# Patient Record
Sex: Female | Born: 1990 | Race: White | Hispanic: No | Marital: Single | Smoking: Never smoker
Health system: Southern US, Community
[De-identification: ages and names within clinical notes are randomized; demographics above are authoritative.]

## PROBLEM LIST (undated history)

## (undated) DIAGNOSIS — F419 Anxiety disorder, unspecified: Secondary | ICD-10-CM

## (undated) DIAGNOSIS — J302 Other seasonal allergic rhinitis: Secondary | ICD-10-CM

## (undated) DIAGNOSIS — E079 Disorder of thyroid, unspecified: Secondary | ICD-10-CM

## (undated) DIAGNOSIS — J45909 Unspecified asthma, uncomplicated: Secondary | ICD-10-CM

## (undated) HISTORY — DX: Other seasonal allergic rhinitis: J30.2

## (undated) HISTORY — PX: DENTAL SURGERY: SHX609

## (undated) HISTORY — DX: Disorder of thyroid, unspecified: E07.9

## (undated) HISTORY — DX: Anxiety disorder, unspecified: F41.9

---

## 2013-06-07 ENCOUNTER — Encounter: Payer: Self-pay | Admitting: Podiatry

## 2013-06-07 ENCOUNTER — Ambulatory Visit (INDEPENDENT_AMBULATORY_CARE_PROVIDER_SITE_OTHER): Admitting: Podiatry

## 2013-06-07 VITALS — BP 124/85 | HR 76 | Resp 16

## 2013-06-07 DIAGNOSIS — L600 Ingrowing nail: Secondary | ICD-10-CM

## 2013-06-07 DIAGNOSIS — B351 Tinea unguium: Secondary | ICD-10-CM

## 2013-06-07 NOTE — Progress Notes (Signed)
   Subjective:    Patient ID: Taylor Gomez, female    DOB: 05/04/1990, 23 y.o.   MRN: [id number]  HPI Comments: "I have a toenail that I injured and now its just been really sore"  Patient c/o aching 1st toe left for about 1 year. She injured the toe moving last year and the nail was knocked loose. She pulled the nail off then, but now grown back discolored and sore. The area is red and swollen. She would like to keep the nail if possible.     Review of Systems  All other systems reviewed and are negative.      Objective:   Physical Exam        Assessment & Plan:

## 2013-06-07 NOTE — Progress Notes (Signed)
Subjective:     Patient ID: Taylor Gomez, female   DOB: 07-19-1990, 23 y.o.   MRN: [id number]  HPI patient states the big toenail my left foot has been bothering for around a year gets ingrown and the top of it is loose and already lost at one time   Review of Systems  All other systems reviewed and are negative.      Objective:   Physical Exam  Nursing note and vitals reviewed. Constitutional: She is oriented to person, place, and time.  Cardiovascular: Intact distal pulses.   Musculoskeletal: Normal range of motion.  Neurological: She is oriented to person, place, and time.  Skin: Skin is warm.   neurovascular status intact with muscle strength adequate in range of motion subtalar midtarsal joint within normal limits. Patient is found to have an incurvated left hallux medial border with the rest and the nail being loose yellow and abnormal in appearance. 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 I recommended removal of the medial corner removing the rest of the nail and allowing it to regrow with possibility that long-term she will lose the entire nail permanently. She wants this procedure and today I infiltrated 60 mg Xylocaine Marcaine mixture remove the medial border exposed matrix and applied chemical 3 applications phenol 30 seconds followed by alcohol and then removal of the remainder the nail and sterile dressing. Gave instructions on soaks

## 2013-06-07 NOTE — Patient Instructions (Signed)

## 2013-09-15 ENCOUNTER — Encounter: Payer: Self-pay | Admitting: Neurology

## 2013-09-15 ENCOUNTER — Ambulatory Visit (INDEPENDENT_AMBULATORY_CARE_PROVIDER_SITE_OTHER): Admitting: Neurology

## 2013-09-15 VITALS — BP 104/60 | HR 73 | Ht 67.0 in | Wt 207.0 lb

## 2013-09-15 DIAGNOSIS — Z5181 Encounter for therapeutic drug level monitoring: Secondary | ICD-10-CM

## 2013-09-15 DIAGNOSIS — R251 Tremor, unspecified: Secondary | ICD-10-CM

## 2013-09-15 DIAGNOSIS — G43709 Chronic migraine without aura, not intractable, without status migrainosus: Secondary | ICD-10-CM

## 2013-09-15 DIAGNOSIS — R5381 Other malaise: Secondary | ICD-10-CM

## 2013-09-15 DIAGNOSIS — E039 Hypothyroidism, unspecified: Secondary | ICD-10-CM

## 2013-09-15 DIAGNOSIS — R5383 Other fatigue: Secondary | ICD-10-CM

## 2013-09-15 DIAGNOSIS — R209 Unspecified disturbances of skin sensation: Secondary | ICD-10-CM

## 2013-09-15 DIAGNOSIS — R202 Paresthesia of skin: Secondary | ICD-10-CM

## 2013-09-15 DIAGNOSIS — R259 Unspecified abnormal involuntary movements: Secondary | ICD-10-CM

## 2013-09-15 LAB — HEPATIC FUNCTION PANEL
ALT: 20 U/L (ref 0–35)
AST: 19 U/L (ref 0–37)
Albumin: 4.2 g/dL (ref 3.5–5.2)
Alkaline Phosphatase: 77 U/L (ref 39–117)
BILIRUBIN DIRECT: 0.1 mg/dL (ref 0.0–0.3)
BILIRUBIN TOTAL: 0.4 mg/dL (ref 0.2–1.2)
Indirect Bilirubin: 0.3 mg/dL (ref 0.2–1.2)
Total Protein: 7.4 g/dL (ref 6.0–8.3)

## 2013-09-15 LAB — COMPREHENSIVE METABOLIC PANEL
ALBUMIN: 4.2 g/dL (ref 3.5–5.2)
ALK PHOS: 77 U/L (ref 39–117)
ALT: 20 U/L (ref 0–35)
AST: 19 U/L (ref 0–37)
BILIRUBIN TOTAL: 0.4 mg/dL (ref 0.2–1.2)
BUN: 12 mg/dL (ref 6–23)
CO2: 28 mEq/L (ref 19–32)
Calcium: 9.6 mg/dL (ref 8.4–10.5)
Chloride: 105 mEq/L (ref 96–112)
Creat: 0.73 mg/dL (ref 0.50–1.10)
GLUCOSE: 74 mg/dL (ref 70–99)
Potassium: 4.2 mEq/L (ref 3.5–5.3)
SODIUM: 141 meq/L (ref 135–145)
TOTAL PROTEIN: 7.4 g/dL (ref 6.0–8.3)

## 2013-09-15 LAB — CBC WITH DIFFERENTIAL/PLATELET
BASOS PCT: 1 % (ref 0–1)
Basophils Absolute: 0.1 10*3/uL (ref 0.0–0.1)
EOS ABS: 0.2 10*3/uL (ref 0.0–0.7)
Eosinophils Relative: 3 % (ref 0–5)
HEMATOCRIT: 36.2 % (ref 36.0–46.0)
HEMOGLOBIN: 12.1 g/dL (ref 12.0–15.0)
LYMPHS ABS: 2 10*3/uL (ref 0.7–4.0)
Lymphocytes Relative: 33 % (ref 12–46)
MCH: 25.5 pg — AB (ref 26.0–34.0)
MCHC: 33.4 g/dL (ref 30.0–36.0)
MCV: 76.2 fL — AB (ref 78.0–100.0)
MONO ABS: 0.5 10*3/uL (ref 0.1–1.0)
MONOS PCT: 9 % (ref 3–12)
Neutro Abs: 3.2 10*3/uL (ref 1.7–7.7)
Neutrophils Relative %: 54 % (ref 43–77)
Platelets: 312 10*3/uL (ref 150–400)
RBC: 4.75 MIL/uL (ref 3.87–5.11)
RDW: 14.7 % (ref 11.5–15.5)
WBC: 6 10*3/uL (ref 4.0–10.5)

## 2013-09-15 LAB — VITAMIN B12: VITAMIN B 12: 390 pg/mL (ref 211–911)

## 2013-09-15 MED ORDER — TOPIRAMATE 25 MG PO TABS: ORAL_TABLET | ORAL | Status: DC

## 2013-09-15 MED ORDER — TOPIRAMATE 100 MG PO TABS: 100.0000 mg | ORAL_TABLET | Freq: Every day | ORAL | Status: DC

## 2013-09-15 NOTE — Addendum Note (Signed)
Addended bySilvio Pate on: 09/15/2013 09:18 AM   Modules accepted: Orders

## 2013-09-15 NOTE — Progress Notes (Signed)
Subjective:   Taylor Gomez was seen in consultation in the movement disorder clinic at the request of [facility], MD.  The evaluation is for tremor.  The patient is a 23 y.o. right handed female with a history of tremor.  Pt states that it may have started about 6 months ago but it started bothering her a month ago because it was daily then.  It happens at rest mostly.  She doesn't know if it is worse any particular time of time.  Stress makes it worse.  If she grabs it and holds it she can make it stop for a short time.  There is no family hx of tremor.  She takes singulair at night and has been on it for 3-4 months.  She doesn't know if it affects tremor.  Noted some trouble with balance.  Admits to some posterior headaches.  Occur in the R occiput and radiates to the right eye.  They have been going on for about 6 years, but are now daily.  They are described as throbbing in nature.  Sometimes with photophobia.  No nausea or vomiting.  Never had any brain scan. She worries about a brain tumor because her grandma had one.  No changes in her voice.  Sleeps well at night but screams out and curses at night which is very unusual behavior for her.  Rare visual distortions.  Sense of taste is decreased as is smell but has significant allergies and relates to that.    Affected by caffeine:  Doesn't drink enough to know Affected by alcohol:  Doesn't drink alcohol Affected by stress:  Yes.   Affected by fatigue:  No. Spills soup if on spoon:  Yes.   Spills glass of liquid if full:  No. Affects ADL's (tying shoes, brushing teeth, etc):  No. but some trouble putting on makeup  Current/Previously tried tremor medications: None  Current medications that may exacerbate tremor:  Singulair  Outside reports reviewed: historical medical records, office notes and referral letter/letters.  No Known Allergies  Outpatient Encounter Prescriptions as of 09/15/2013  Medication Sig  . cetirizine (ZYRTEC) 10 MG  tablet Take 10 mg by mouth daily.  Marland Kitchen levothyroxine (SYNTHROID, LEVOTHROID) 50 MCG tablet Take 50 mcg by mouth daily before breakfast.  . montelukast (SINGULAIR) 10 MG tablet Take 10 mg by mouth at bedtime.    Past Medical History  Diagnosis Date  . Seasonal allergies   . Thyroid disease   . Anxiety     Past Surgical History  Procedure Laterality Date  . Dental surgery      History   Social History  . Marital Status: Unknown    Spouse Name: N/A    Number of Children: N/A  . Years of Education: N/A   Occupational History  .  Other    Nanny   Social History Main Topics  . Smoking status: Never Smoker   . Smokeless tobacco: Not on file  . Alcohol Use: No  . Drug Use: No  . Sexual Activity: Not on file   Other Topics Concern  . Not on file   Social History Narrative  . No narrative on file    Family Status  Relation Status Death Age  . Mother Alive     breast cancer  . Father Alive     healthy  . Sister Alive     healthy  . Sister Alive     healthy    Review of Systems Burning paresthesias  of the plantar aspect of the R foot and the 2nd toe on the right foot.  A complete 10 system ROS was obtained and was negative apart from what is mentioned.   Objective:   VITALS:   Filed Vitals:   09/15/13 0754  BP: 104/60  Pulse: 73  Height:  (1.702 m)  Weight: 207 lb (93.895 kg)   Gen:  Appears stated age and in NAD. HEENT:  Normocephalic, atraumatic. The mucous membranes are moist. The superficial temporal arteries are without ropiness or tenderness. Cardiovascular: Regular rate and rhythm. Lungs: Clear to auscultation bilaterally. Neck: There are no carotid bruits noted bilaterally.  NEUROLOGICAL:  Orientation:  The patient is alert and oriented x 3.  Recent and remote memory are intact.  Attention span and concentration are normal.  Able to name objects and repeat without trouble.  Fund of knowledge is appropriate Cranial nerves: There is good  facial symmetry. The pupils are equal round and reactive to light bilaterally. Fundoscopic exam reveals clear disc margins bilaterally. Extraocular muscles are intact and visual fields are full to confrontational testing. Speech is fluent and clear. Soft palate rises symmetrically and there is no tongue deviation. Hearing is intact to conversational tone. Tone: Tone is good throughout. Sensation: Sensation is intact to light touch and pinprick throughout (facial, trunk, extremities). Vibration is intact at the bilateral big toe. There is no extinction with double simultaneous stimulation. There is no sensory dermatomal level identified. Coordination:  The patient has no dysdiadichokinesia or dysmetria. Motor: Strength is 5/5 in the bilateral upper and lower extremities.  Shoulder shrug is equal bilaterally.  There is no pronator drift.  There are no fasciculations noted. DTR's: Deep tendon reflexes are 2/4 at the bilateral biceps, triceps, brachioradialis, patella and achilles.  Plantar responses are downgoing bilaterally. Gait and Station: The patient is able to ambulate without difficulty. The patient is able to heel toe walk without any difficulty. The patient is able to ambulate in a tandem fashion. The patient is able to stand in the Romberg position.   MOVEMENT EXAM: Tremor:  There is some tremor in the right upper extremity that is distractible at rest and not present when she is counting or naming the months backwards.  It is not present with intention.  It is present mildly when she is pouring water from one glass to another.  She has very little difficulty with Archimedes spirals.  She has no difficulty with writing a sentence.     Assessment/Plan:   1.  Tremor.  -I wonder if this is multifactorial.  She noticed an increase in tremor over the last month and was put on Singulair just a few months ago.  This can pick up tremor.  I also wonder if anxiety is a component to her tremor as it was  somewhat distractible.  Her primary care physician has checked her TSH, and her sensory was recently adjusted.  I will try to get a copy of her recent labs.  I will also draw a CBC, CMP as well as a B12 (given fatigue and paresthesias), copper and ceruloplasmin.  -We would do an MRI of the brain given the asymmetry of tremor and headache, although I see nothing unusual on her examination today. 2.  transformed migraine/chronic migraine.  -She and I talked about the diagnosis.  We talked about treatment that would help both tremor and migraine, and ultimately decided that she would like to try Topamax.  We talked about risks/benefits/side effects.  If she become sexually active, she understands that she should use birth control.  She understands that this medicine could reduce the efficacy of oral contraceptives.  She understands the increased risk of nephrolithiasis.  She understands the risk of cognitive dulling.  She understands that this medication could increase paresthesias initially. 3.  Paresthesias.  -The patient asks about I nerve conduction study.  I really do not think that this would be useful.  I found no evidence of neuropathy or radiculopathy on her examination today.  She only has paresthesias along a very small portion of the plantar aspect of her foot and second toe on the right, and I think I nerve conduction study would be of low yield.  She has no back pain.  I told her I think we should hold on that. 4.  followup in the next few months, sooner should new neurologic issues arise.

## 2013-09-15 NOTE — Patient Instructions (Addendum)
1.Your provider has requested that you have labwork completed today. Please go to [facility] on the first floor of this building before leaving the office today. 2. We have scheduled you at [facility] for your MRI on 09/28/13 at 1:00 pm. Please arrive 15 minutes prior and go to 1st floor radiology. If you need to reschedule for any reason please call [phone]. 3. Take Topamax at follows: 25 mg tablets - take one daily for one week, then increase to two daily for one week, then increase to three daily for one week and then start 100 mg tablets one tablet daily.

## 2013-09-17 LAB — COPPER, SERUM: Copper: 97 ug/dL (ref 70–175)

## 2013-09-19 LAB — CERULOPLASMIN: Ceruloplasmin: 23 mg/dL (ref 18–53)

## 2013-09-28 ENCOUNTER — Emergency Department (HOSPITAL_COMMUNITY)

## 2013-09-28 ENCOUNTER — Ambulatory Visit (HOSPITAL_COMMUNITY)
Admission: RE | Admit: 2013-09-28 | Discharge: 2013-09-28 | Disposition: A | Discharge status: Home / Self Care | Source: Ambulatory Visit | Attending: Neurology | Admitting: Neurology

## 2013-09-28 ENCOUNTER — Emergency Department (HOSPITAL_COMMUNITY)
Admission: EM | Admit: 2013-09-28 | Discharge: 2013-09-29 | Disposition: A | Discharge status: Home / Self Care | Attending: Emergency Medicine | Admitting: Emergency Medicine

## 2013-09-28 ENCOUNTER — Encounter (HOSPITAL_COMMUNITY): Payer: Self-pay | Admitting: Emergency Medicine

## 2013-09-28 DIAGNOSIS — Z3202 Encounter for pregnancy test, result negative: Secondary | ICD-10-CM | POA: Diagnosis not present

## 2013-09-28 DIAGNOSIS — Z79899 Other long term (current) drug therapy: Secondary | ICD-10-CM | POA: Diagnosis not present

## 2013-09-28 DIAGNOSIS — R1031 Right lower quadrant pain: Secondary | ICD-10-CM | POA: Insufficient documentation

## 2013-09-28 DIAGNOSIS — E079 Disorder of thyroid, unspecified: Secondary | ICD-10-CM | POA: Diagnosis not present

## 2013-09-28 DIAGNOSIS — G43709 Chronic migraine without aura, not intractable, without status migrainosus: Secondary | ICD-10-CM | POA: Diagnosis not present

## 2013-09-28 DIAGNOSIS — Z8659 Personal history of other mental and behavioral disorders: Secondary | ICD-10-CM | POA: Insufficient documentation

## 2013-09-28 DIAGNOSIS — R112 Nausea with vomiting, unspecified: Secondary | ICD-10-CM | POA: Diagnosis not present

## 2013-09-28 LAB — URINALYSIS, ROUTINE W REFLEX MICROSCOPIC
Bilirubin Urine: NEGATIVE
Glucose, UA: NEGATIVE mg/dL
Hgb urine dipstick: NEGATIVE
KETONES UR: NEGATIVE mg/dL
LEUKOCYTES UA: NEGATIVE
NITRITE: NEGATIVE
PH: 6.5 (ref 5.0–8.0)
PROTEIN: NEGATIVE mg/dL
Specific Gravity, Urine: 1.005 (ref 1.005–1.030)
UROBILINOGEN UA: 0.2 mg/dL (ref 0.0–1.0)

## 2013-09-28 LAB — COMPREHENSIVE METABOLIC PANEL
ALT: 17 U/L (ref 0–35)
ANION GAP: 13 (ref 5–15)
AST: 20 U/L (ref 0–37)
Albumin: 3.8 g/dL (ref 3.5–5.2)
Alkaline Phosphatase: 80 U/L (ref 39–117)
BUN: 12 mg/dL (ref 6–23)
CO2: 24 mEq/L (ref 19–32)
Calcium: 9.3 mg/dL (ref 8.4–10.5)
Chloride: 105 mEq/L (ref 96–112)
Creatinine, Ser: 0.69 mg/dL (ref 0.50–1.10)
GFR calc Af Amer: >90 mL/min (ref >90)
GFR calc non Af Amer: >90 mL/min (ref >90)
Glucose, Bld: 94 mg/dL (ref 70–99)
Potassium: 4 mEq/L (ref 3.7–5.3)
SODIUM: 142 meq/L (ref 137–147)
Total Bilirubin: 0.3 mg/dL (ref 0.3–1.2)
Total Protein: 7.7 g/dL (ref 6.0–8.3)

## 2013-09-28 LAB — CBC WITH DIFFERENTIAL/PLATELET
Basophils Absolute: 0.1 10*3/uL (ref 0.0–0.1)
Basophils Relative: 1 % (ref 0–1)
Eosinophils Absolute: 0.2 10*3/uL (ref 0.0–0.7)
Eosinophils Relative: 2 % (ref 0–5)
HCT: 38 % (ref 36.0–46.0)
Hemoglobin: 12.7 g/dL (ref 12.0–15.0)
LYMPHS ABS: 2.5 10*3/uL (ref 0.7–4.0)
Lymphocytes Relative: 35 % (ref 12–46)
MCH: 26.2 pg (ref 26.0–34.0)
MCHC: 33.4 g/dL (ref 30.0–36.0)
MCV: 78.4 fL (ref 78.0–100.0)
Monocytes Absolute: 0.5 10*3/uL (ref 0.1–1.0)
Monocytes Relative: 7 % (ref 3–12)
Neutro Abs: 4 10*3/uL (ref 1.7–7.7)
Neutrophils Relative %: 55 % (ref 43–77)
PLATELETS: 332 10*3/uL (ref 150–400)
RBC: 4.85 MIL/uL (ref 3.87–5.11)
RDW: 14 % (ref 11.5–15.5)
WBC: 7.2 10*3/uL (ref 4.0–10.5)

## 2013-09-28 LAB — WET PREP, GENITAL
Trich, Wet Prep: NONE SEEN
WBC, Wet Prep HPF POC: NONE SEEN
Yeast Wet Prep HPF POC: NONE SEEN

## 2013-09-28 LAB — PREGNANCY, URINE: Preg Test, Ur: NEGATIVE

## 2013-09-28 LAB — LIPASE, BLOOD: Lipase: 27 U/L (ref 11–59)

## 2013-09-28 MED ORDER — MORPHINE SULFATE 4 MG/ML IJ SOLN
4.0000 mg | Freq: Once | INTRAMUSCULAR | Status: AC
  Administered 2013-09-29: 4 mg via INTRAVENOUS
  Filled 2013-09-28: qty 1

## 2013-09-28 MED ORDER — IOHEXOL 300 MG/ML  SOLN
25.0000 mL | Freq: Once | INTRAMUSCULAR | Status: AC | PRN
  Administered 2013-09-28: 25 mL via ORAL

## 2013-09-28 MED ORDER — ONDANSETRON HCL 4 MG/2ML IJ SOLN
4.0000 mg | Freq: Once | INTRAMUSCULAR | Status: AC
  Administered 2013-09-28: 4 mg via INTRAVENOUS
  Filled 2013-09-28: qty 2

## 2013-09-28 MED ORDER — MORPHINE SULFATE 4 MG/ML IJ SOLN
4.0000 mg | Freq: Once | INTRAMUSCULAR | Status: AC
  Administered 2013-09-28: 4 mg via INTRAVENOUS
  Filled 2013-09-28: qty 1

## 2013-09-28 MED ORDER — IOHEXOL 300 MG/ML  SOLN
100.0000 mL | Freq: Once | INTRAMUSCULAR | Status: AC | PRN
  Administered 2013-09-28: 100 mL via INTRAVENOUS

## 2013-09-28 NOTE — ED Provider Notes (Signed)
CSN: [id number]     Arrival date & time 09/28/13  1946 History   First MD Initiated Contact with Patient 09/28/13 2058     Chief Complaint  Patient presents with  . Abdominal Pain     (Consider location/radiation/quality/duration/timing/severity/associated sxs/prior Treatment) HPI Taylor Gomez is a 23 y.o. female who presents emergency department complaining of abdominal pain. Patient states she has high dural abdominal pain for several days, however yesterday she developed sharp pain in her right lower abdomen which has been constant since. States pain is worsened with walking and movement. Certain positions make pain better, certainly worse. She denies any nausea or vomiting. She denies any urinary symptoms, no vaginal discharge or bleeding. She denies any fever or chills. She admits to having no appetite today. Patient went to urgent care, had urinalyses done there, was told to report to emergency department for further evaluation. Patient denies any prior abdominal surgeries, no other associated symptoms at this time.  Past Medical History  Diagnosis Date  . Seasonal allergies   . Thyroid disease   . Anxiety    Past Surgical History  Procedure Laterality Date  . Dental surgery     No family history on file. History  Substance Use Topics  . Smoking status: Never Smoker   . Smokeless tobacco: Not on file  . Alcohol Use: No   OB History   Grav Para Term Preterm Abortions TAB SAB Ect Mult Living                 Review of Systems  Constitutional: Negative for fever and chills.  Respiratory: Negative for cough, chest tightness and shortness of breath.   Cardiovascular: Negative for chest pain, palpitations and leg swelling.  Gastrointestinal: Positive for nausea, vomiting and abdominal pain. Negative for diarrhea.  Genitourinary: Positive for pelvic pain. Negative for dysuria, flank pain, vaginal bleeding, vaginal discharge and vaginal pain.  Musculoskeletal: Negative for  arthralgias, myalgias, neck pain and neck stiffness.  Skin: Negative for rash.  Neurological: Negative for dizziness, weakness and headaches.  All other systems reviewed and are negative.     Allergies  Review of patient's allergies indicates no known allergies.  Home Medications   Prior to Admission medications   Medication Sig Start Date End Date Taking? Authorizing Provider  cetirizine (ZYRTEC) 10 MG tablet Take 10 mg by mouth daily.    Historical Provider, MD  levothyroxine (SYNTHROID, LEVOTHROID) 50 MCG tablet Take 50 mcg by mouth daily before breakfast.    Historical Provider, MD  montelukast (SINGULAIR) 10 MG tablet Take 10 mg by mouth at bedtime.    Historical Provider, MD  topiramate (TOPAMAX) 100 MG tablet Take 1 tablet (100 mg total) by mouth daily. 09/15/13   Octaviano Batty Tat, DO  topiramate (TOPAMAX) 25 MG tablet Take 1 tablet by mouth for one week, then 2 tablets by mouth for 1 week, then 3 tablets by mouth for 1 week then switch to 100 mg tablets 09/15/13   Rebecca S Tat, DO   BP 127/76  Pulse 86  Temp(Src) 98.5 F (36.9 C) (Oral)  Resp 14  Ht  (1.702 m)  Wt 206 lb (93.441 kg)  BMI 32.26 kg/m2  SpO2 99%  LMP 08/08/2013 Physical Exam  Nursing note and vitals reviewed. Constitutional: She appears well-developed and well-nourished. No distress.  HENT:  Head: Normocephalic.  Eyes: Conjunctivae are normal.  Neck: Neck supple.  Cardiovascular: Normal rate, regular rhythm and normal heart sounds.   Pulmonary/Chest: Effort normal  and breath sounds normal. No respiratory distress. She has no wheezes. She has no rales.  Abdominal: Soft. Bowel sounds are normal. She exhibits no distension. There is tenderness. There is no rebound.  RLQ tenderness, tender at mcburney's point  Genitourinary:  Normal external genitalia. No vaginal discharge. Cervix normal. No CMT. Right adnexal tenderness  Musculoskeletal: She exhibits no edema.  Neurological: She is alert.  Skin: Skin is  warm and dry.  Psychiatric: She has a normal mood and affect. Her behavior is normal.    ED Course  Procedures (including critical care time) Labs Review Labs Reviewed  WET PREP, GENITAL  GC/CHLAMYDIA PROBE AMP  CBC WITH DIFFERENTIAL  COMPREHENSIVE METABOLIC PANEL  LIPASE, BLOOD  PREGNANCY, URINE  URINALYSIS, ROUTINE W REFLEX MICROSCOPIC    Imaging Review Mr Brain Wo Contrast  09/28/2013   CLINICAL DATA:  Migraine headache  EXAM: MRI HEAD WITHOUT CONTRAST  TECHNIQUE: Multiplanar, multiecho pulse sequences of the brain and surrounding structures were obtained without intravenous contrast.  COMPARISON:  None.  FINDINGS: Ventricle size is normal. Negative for Chiari malformation. Pituitary normal in size. Cerebral volume normal.  Negative for acute infarct  Negative for chronic ischemia.  Negative for demyelinating disease. Cerebral white matter is normal. Brainstem and cerebellum are normal.  Negative for hemorrhage.  Negative for mass or edema.  Paranasal sinuses are clear.  IMPRESSION: Normal   Electronically Signed   By: Marlan Palau M.D.   On: 09/28/2013 16:19     EKG Interpretation None      MDM   Final diagnoses:  None    Pt with RLQ tenderness for 2 days. Question appy vs right ovrain cyst or torsion. Given symptoms since yesterday, non ill appearing, will get CT abd/pelvis to start work up. If negative will need US pelvis to rule out torsion.  1:12 AM CT negative for appy. Right ovarian cyst. Will [place] for further evaluation and to r/o torsion. Pain managed with morphine and zofran.   Filed Vitals:   09/28/13 2143 09/28/13 2200 09/28/13 2300 09/29/13 0054  BP:  126/78 115/62   Pulse:  84 72   Temp: 98.6 F (37 C)   98 F (36.7 C)  TempSrc: Oral   Oral  Resp:  16 15   Height:      Weight:      SpO2:  100% 99%      2:09 AM Pt signed out at shift change to Dr. Raliegh Ip, PA-C 09/29/13 [phone]

## 2013-09-28 NOTE — ED Notes (Signed)
Pt. reports RLQ pain onset 3 days ago with emesis x2 yesterday , denies nausea or vomitting , no fever or chills.

## 2013-09-29 ENCOUNTER — Telehealth: Payer: Self-pay | Admitting: Neurology

## 2013-09-29 ENCOUNTER — Emergency Department (HOSPITAL_COMMUNITY)

## 2013-09-29 MED ORDER — NAPROXEN 500 MG PO TABS: 500.0000 mg | ORAL_TABLET | Freq: Two times a day (BID) | ORAL | Status: DC

## 2013-09-29 MED ORDER — OXYCODONE-ACETAMINOPHEN 5-325 MG PO TABS
1.0000 | ORAL_TABLET | Freq: Four times a day (QID) | ORAL | Status: DC | PRN
Start: 2013-09-29 — End: 2014-07-01

## 2013-09-29 MED ORDER — ONDANSETRON HCL 4 MG/2ML IJ SOLN
4.0000 mg | Freq: Once | INTRAMUSCULAR | Status: AC
  Administered 2013-09-29: 4 mg via INTRAVENOUS
  Filled 2013-09-29: qty 2

## 2013-09-29 NOTE — ED Provider Notes (Signed)
Medical screening examination/treatment/procedure(s) were performed by non-physician practitioner and as supervising physician I was immediately available for consultation/collaboration.   EKG Interpretation None        Porshe Fleagle, MD 09/29/13 0734 

## 2013-09-29 NOTE — Telephone Encounter (Signed)
Message copied by Silvio Pate on Fri Sep 29, 2013  8:56 AM ------      Message from: TAT, REBECCA S      Created: Thu Sep 28, 2013  4:33 PM       Please call pt and let her know that I reviewed MRI brain and looks good. ------

## 2013-09-29 NOTE — Telephone Encounter (Signed)
Patient returned your call. She was advised of her MRI results

## 2013-09-29 NOTE — ED Provider Notes (Signed)
1610 - Patient care assumed from [facility], PA-C at shift change. Patient with pelvic ultrasound pending to rule out torsion. Imaging reviewed which shows no evidence of torsion. Ovaries are unremarkable. Unclear etiology for abdominal pain; however, patient does not appear to be emergent in nature. Possible that symptoms may be viral vs ruptured ovarian cyst vs mesenteric adenitis. Will treat as outpatient with naproxen. Percocet prescribed for breakthrough pain control as needed. Return precautions discussed and provided. Patient agreeable to plan with no unaddressed concerns.   Filed Vitals:   09/29/13 0054 09/29/13 0100 09/29/13 0200 09/29/13 0201  BP:  123/79 113/56 113/56  Pulse:  83 80 86  Temp: 98 F (36.7 C)   98.3 F (36.8 C)  TempSrc: Oral   Oral  Resp:  Height:      Weight:      SpO2:  99% 98% 99%   Results for orders placed during the hospital encounter of 09/28/13  WET PREP, GENITAL      Result Value Ref Range   Yeast Wet Prep HPF POC NONE SEEN  NONE SEEN   Trich, Wet Prep NONE SEEN  NONE SEEN   Clue Cells Wet Prep HPF POC FEW (*) NONE SEEN   WBC, Wet Prep HPF POC NONE SEEN  NONE SEEN  CBC WITH DIFFERENTIAL      Result Value Ref Range   WBC 7.2  4.0 - 10.5 K/uL   RBC 4.85  3.87 - 5.11 MIL/uL   Hemoglobin 12.7  12.0 - 15.0 g/dL   HCT 96.0  45.4 - 09.8 %   MCV 78.4  78.0 - 100.0 fL   MCH 26.2  26.0 - 34.0 pg   MCHC 33.4  30.0 - 36.0 g/dL   RDW 11.9  14.7 - 82.9 %   Platelets 332  150 - 400 K/uL   Neutrophils Relative % 55  43 - 77 %   Neutro Abs 4.0  1.7 - 7.7 K/uL   Lymphocytes Relative 35  12 - 46 %   Lymphs Abs 2.5  0.7 - 4.0 K/uL   Monocytes Relative 7  3 - 12 %   Monocytes Absolute 0.5  0.1 - 1.0 K/uL   Eosinophils Relative 2  0 - 5 %   Eosinophils Absolute 0.2  0.0 - 0.7 K/uL   Basophils Relative 1  0 - 1 %   Basophils Absolute 0.1  0.0 - 0.1 K/uL  COMPREHENSIVE METABOLIC PANEL      Result Value Ref Range   Sodium 142  137 - 147 mEq/L    Potassium 4.0  3.7 - 5.3 mEq/L   Chloride 105  96 - 112 mEq/L   CO2 24  19 - 32 mEq/L   Glucose, Bld 94  70 - 99 mg/dL   BUN 12  6 - 23 mg/dL   Creatinine, Ser 5.62  0.50 - 1.10 mg/dL   Calcium 9.3  8.4 - 13.0 mg/dL   Total Protein 7.7  6.0 - 8.3 g/dL   Albumin 3.8  3.5 - 5.2 g/dL   AST 20  0 - 37 U/L   ALT 17  0 - 35 U/L   Alkaline Phosphatase 80  39 - 117 U/L   Total Bilirubin 0.3  0.3 - 1.2 mg/dL   GFR calc non Af Amer >90  >90 mL/min   GFR calc Af Amer >90  >90 mL/min   Anion gap 13  5 - 15  LIPASE, BLOOD  Result Value Ref Range   Lipase 27  11 - 59 U/L  PREGNANCY, URINE      Result Value Ref Range   Preg Test, Ur NEGATIVE  NEGATIVE  URINALYSIS, ROUTINE W REFLEX MICROSCOPIC      Result Value Ref Range   Color, Urine YELLOW  YELLOW   APPearance CLEAR  CLEAR   Specific Gravity, Urine 1.005  1.005 - 1.030   pH 6.5  5.0 - 8.0   Glucose, UA NEGATIVE  NEGATIVE mg/dL   Hgb urine dipstick NEGATIVE  NEGATIVE   Bilirubin Urine NEGATIVE  NEGATIVE   Ketones, ur NEGATIVE  NEGATIVE mg/dL   Protein, ur NEGATIVE  NEGATIVE mg/dL   Urobilinogen, UA 0.2  0.0 - 1.0 mg/dL   Nitrite NEGATIVE  NEGATIVE   Leukocytes, UA NEGATIVE  NEGATIVE   Mr Brain Wo Contrast  09/28/2013   CLINICAL DATA:  Migraine headache  EXAM: MRI HEAD WITHOUT CONTRAST  TECHNIQUE: Multiplanar, multiecho pulse sequences of the brain and surrounding structures were obtained without intravenous contrast.  COMPARISON:  None.  FINDINGS: Ventricle size is normal. Negative for Chiari malformation. Pituitary normal in size. Cerebral volume normal.  Negative for acute infarct  Negative for chronic ischemia.  Negative for demyelinating disease. Cerebral white matter is normal. Brainstem and cerebellum are normal.  Negative for hemorrhage.  Negative for mass or edema.  Paranasal sinuses are clear.  IMPRESSION: Normal   Electronically Signed   By: Marlan Palau M.D.   On: 09/28/2013 16:19   US Transvaginal Non-ob  09/29/2013    CLINICAL DATA:  Abdominal pain.  EXAM: TRANSABDOMINAL AND TRANSVAGINAL ULTRASOUND OF PELVIS  DOPPLER ULTRASOUND OF OVARIES  TECHNIQUE: Both transabdominal and transvaginal ultrasound examinations of the pelvis were performed. Transabdominal technique was performed for global imaging of the pelvis including uterus, ovaries, adnexal regions, and pelvic cul-de-sac.  It was necessary to proceed with endovaginal exam following the transabdominal exam to visualize the uterus and ovaries in greater detail. Color and duplex Doppler ultrasound was utilized to evaluate blood flow to the ovaries.  COMPARISON:  CT of the abdomen and pelvis performed 09/28/2013  FINDINGS: Uterus  Measurements: 9.0 x 3.3 x 4.6 cm. No fibroids or other mass visualized.  Endometrium  Thickness: 0.8 cm. No focal abnormality visualized. Trace fluid is noted at the lower uterine segment.  Right ovary  Measurements: 4.1 x 2.5 x 2.7 cm. Normal appearance/no adnexal mass. A normal follicle is noted at the right ovary.  Left ovary  Measurements: 3.3 x 1.7 x 2.4 cm. Normal appearance/no adnexal mass.  Pulsed Doppler evaluation of both ovaries demonstrates normal low-resistance arterial and venous waveforms.  Other findings  A small amount of free fluid is seen within the pelvic cul-de-sac.  IMPRESSION: 1. No evidence of ovarian torsion. Ovaries unremarkable in appearance. 2. Trace fluid at the lower uterine segment. Uterus otherwise unremarkable.   Electronically Signed   By: Roanna Raider M.D.   On: 09/29/2013 03:36   US Pelvis Complete  09/29/2013   CLINICAL DATA:  Abdominal pain.  EXAM: TRANSABDOMINAL AND TRANSVAGINAL ULTRASOUND OF PELVIS  DOPPLER ULTRASOUND OF OVARIES  TECHNIQUE: Both transabdominal and transvaginal ultrasound examinations of the pelvis were performed. Transabdominal technique was performed for global imaging of the pelvis including uterus, ovaries, adnexal regions, and pelvic cul-de-sac.  It was necessary to proceed with  endovaginal exam following the transabdominal exam to visualize the uterus and ovaries in greater detail. Color and duplex Doppler ultrasound was utilized to evaluate blood  flow to the ovaries.  COMPARISON:  CT of the abdomen and pelvis performed 09/28/2013  FINDINGS: Uterus  Measurements: 9.0 x 3.3 x 4.6 cm. No fibroids or other mass visualized.  Endometrium  Thickness: 0.8 cm. No focal abnormality visualized. Trace fluid is noted at the lower uterine segment.  Right ovary  Measurements: 4.1 x 2.5 x 2.7 cm. Normal appearance/no adnexal mass. A normal follicle is noted at the right ovary.  Left ovary  Measurements: 3.3 x 1.7 x 2.4 cm. Normal appearance/no adnexal mass.  Pulsed Doppler evaluation of both ovaries demonstrates normal low-resistance arterial and venous waveforms.  Other findings  A small amount of free fluid is seen within the pelvic cul-de-sac.  IMPRESSION: 1. No evidence of ovarian torsion. Ovaries unremarkable in appearance. 2. Trace fluid at the lower uterine segment. Uterus otherwise unremarkable.   Electronically Signed   By: Roanna Raider M.D.   On: 09/29/2013 03:36   Ct Abdomen Pelvis W Contrast  09/29/2013   CLINICAL DATA:  Right-sided abdominal pain  EXAM: CT ABDOMEN AND PELVIS WITH CONTRAST  TECHNIQUE: Multidetector CT imaging of the abdomen and pelvis was performed using the standard protocol following bolus administration of intravenous contrast.  CONTRAST:  OMNIPAQUE IOHEXOL 300 MG/ML  SOLN  COMPARISON:  None.  FINDINGS: A 3 mm nodule present within the left lower lobe. The visualized lung bases are otherwise clear. No pleural or pericardial effusion.  The liver demonstrates a normal contrast enhanced appearance. Gallbladder within normal limits. No biliary dilatation. The spleen, adrenal glands, and pancreas demonstrate a normal contrast enhanced appearance.  Kidneys are equal in size with symmetric enhancement. No nephrolithiasis, hydronephrosis, or focal enhancing renal mass.   Stomach is within normal limits. No evidence of bowel obstruction. Appendix well visualized in the right lower quadrant and is of normal caliber and appearance without associated inflammatory changes to suggest acute appendicitis. No abnormal wall thickening, mucosal enhancement, or inflammatory stranding seen about the bowels.  Bladder within normal limits.  Fluid density noted within the endometrial canal. Uterus is otherwise unremarkable. Left ovary within normal limits. 2.0 x 2.0 x 1.7 cm cyst present within the right ovary, most compatible with a physiologic cyst. There is associated small volume free fluid within the pelvis.  No free air.  No adenopathy. Normal intravascular enhancement present within the abdomen and pelvis.  No acute osseous abnormality. No worrisome lytic or blastic osseous lesions.  IMPRESSION: 1. 2.0 x 2.0 x 1.7 cm physiologic right ovarian cyst with small volume free fluid within the pelvis. 2. Normal appendix. 3. 3 mm left lower lobe nodule, indeterminate. Please note that Fleischner criteria does not apply to patients of this age.   Electronically Signed   By: Rise Mu M.D.   On: 09/29/2013 00:11   [place] Art/ven Flow Abd Pelv Doppler  09/29/2013   CLINICAL DATA:  Abdominal pain.  EXAM: TRANSABDOMINAL AND TRANSVAGINAL ULTRASOUND OF PELVIS  DOPPLER ULTRASOUND OF OVARIES  TECHNIQUE: Both transabdominal and transvaginal ultrasound examinations of the pelvis were performed. Transabdominal technique was performed for global imaging of the pelvis including uterus, ovaries, adnexal regions, and pelvic cul-de-sac.  It was necessary to proceed with endovaginal exam following the transabdominal exam to visualize the uterus and ovaries in greater detail. Color and duplex Doppler ultrasound was utilized to evaluate blood flow to the ovaries.  COMPARISON:  CT of the abdomen and pelvis performed 09/28/2013  FINDINGS: Uterus  Measurements: 9.0 x 3.3 x 4.6 cm. No fibroids or other mass  visualized.  Endometrium  Thickness: 0.8 cm. No focal abnormality visualized. Trace fluid is noted at the lower uterine segment.  Right ovary  Measurements: 4.1 x 2.5 x 2.7 cm. Normal appearance/no adnexal mass. A normal follicle is noted at the right ovary.  Left ovary  Measurements: 3.3 x 1.7 x 2.4 cm. Normal appearance/no adnexal mass.  Pulsed Doppler evaluation of both ovaries demonstrates normal low-resistance arterial and venous waveforms.  Other findings  A small amount of free fluid is seen within the pelvic cul-de-sac.  IMPRESSION: 1. No evidence of ovarian torsion. Ovaries unremarkable in appearance. 2. Trace fluid at the lower uterine segment. Uterus otherwise unremarkable.   Electronically Signed   By: Roanna Raider M.D.   On: 09/29/2013 03:36      Antony Madura, PA-C 09/29/13 1324

## 2013-09-29 NOTE — Telephone Encounter (Signed)
Left message on machine for patient to call back. To let her know MR brain is okay. Awaiting call back.

## 2013-09-29 NOTE — Discharge Instructions (Signed)
Abdominal Pain, Women °Abdominal (stomach, pelvic, or belly) pain can be caused by many things. It is important to tell your doctor: °· The location of the pain. °· Does it come and go or is it present all the time? °· Are there things that start the pain (eating certain foods, exercise)? °· Are there other symptoms associated with the pain (fever, nausea, vomiting, diarrhea)? °All of this is helpful to know when trying to find the cause of the pain. °CAUSES  °· Stomach: virus or bacteria infection, or ulcer. °· Intestine: appendicitis (inflamed appendix), regional ileitis (Crohn's disease), ulcerative colitis (inflamed colon), irritable bowel syndrome, diverticulitis (inflamed diverticulum of the colon), or cancer of the stomach or intestine. °· Gallbladder disease or stones in the gallbladder. °· Kidney disease, kidney stones, or infection. °· Pancreas infection or cancer. °· Fibromyalgia (pain disorder). °· Diseases of the female organs: °¨ Uterus: fibroid (non-cancerous) tumors or infection. °¨ Fallopian tubes: infection or tubal pregnancy. °¨ Ovary: cysts or tumors. °¨ Pelvic adhesions (scar tissue). °¨ Endometriosis (uterus lining tissue growing in the pelvis and on the pelvic organs). °¨ Pelvic congestion syndrome (female organs filling up with blood just before the menstrual period). °¨ Pain with the menstrual period. °¨ Pain with ovulation (producing an egg). °¨ Pain with an IUD (intrauterine device, birth control) in the uterus. °¨ Cancer of the female organs. °· Functional pain (pain not caused by a disease, may improve without treatment). °· Psychological pain. °· Depression. °DIAGNOSIS  °Your doctor will decide the seriousness of your pain by doing an examination. °· Blood tests. °· X-rays. °· Ultrasound. °· CT scan (computed tomography, special type of X-ray). °· MRI (magnetic resonance imaging). °· Cultures, for infection. °· Barium enema (dye inserted in the large intestine, to better view it with  X-rays). °· Colonoscopy (looking in intestine with a lighted tube). °· Laparoscopy (minor surgery, looking in abdomen with a lighted tube). °· Major abdominal exploratory surgery (looking in abdomen with a large incision). °TREATMENT  °The treatment will depend on the cause of the pain.  °· Many cases can be observed and treated at home. °· Over-the-counter medicines recommended by your caregiver. °· Prescription medicine. °· Antibiotics, for infection. °· Birth control pills, for painful periods or for ovulation pain. °· Hormone treatment, for endometriosis. °· Nerve blocking injections. °· Physical therapy. °· Antidepressants. °· Counseling with a psychologist or psychiatrist. °· Minor or major surgery. °HOME CARE INSTRUCTIONS  °· Do not take laxatives, unless directed by your caregiver. °· Take over-the-counter pain medicine only if ordered by your caregiver. Do not take aspirin because it can cause an upset stomach or bleeding. °· Try a clear liquid diet (broth or water) as ordered by your caregiver. Slowly move to a bland diet, as tolerated, if the pain is related to the stomach or intestine. °· Have a thermometer and take your temperature several times a day, and record it. °· Bed rest and sleep, if it helps the pain. °· Avoid sexual intercourse, if it causes pain. °· Avoid stressful situations. °· Keep your follow-up appointments and tests, as your caregiver orders. °· If the pain does not go away with medicine or surgery, you may try: °¨ Acupuncture. °¨ Relaxation exercises (yoga, meditation). °¨ Group therapy. °¨ Counseling. °SEEK MEDICAL CARE IF:  °· You notice certain foods cause stomach pain. °· Your home care treatment is not helping your pain. °· You need stronger pain medicine. °· You want your IUD removed. °· You feel faint or   lightheaded. °· You develop nausea and vomiting. °· You develop a rash. °· You are having side effects or an allergy to your medicine. °SEEK IMMEDIATE MEDICAL CARE IF:  °· Your  pain does not go away or gets worse. °· You have a fever. °· Your pain is felt only in portions of the abdomen. The right side could possibly be appendicitis. The left lower portion of the abdomen could be colitis or diverticulitis. °· You are passing blood in your stools (bright red or black tarry stools, with or without vomiting). °· You have blood in your urine. °· You develop chills, with or without a fever. °· You pass out. °MAKE SURE YOU:  °· Understand these instructions. °· Will watch your condition. °· Will get help right away if you are not doing well or get worse. °Document Released: 10/26/2006 Document Revised: 05/15/2013 Document Reviewed: 11/15/2008 °ExitCare® Patient Information ©2015 [employer]. This information is not intended to replace advice given to you by your health care provider. Make sure you discuss any questions you have with your health care provider. ° °

## 2013-09-30 LAB — GC/CHLAMYDIA PROBE AMP
CT Probe RNA: NEGATIVE
GC Probe RNA: NEGATIVE

## 2013-09-30 NOTE — ED Provider Notes (Signed)
Medical screening examination/treatment/procedure(s) were performed by non-physician practitioner and as supervising physician I was immediately available for consultation/collaboration.   EKG Interpretation None        Layla Maw Josilyn Shippee, DO 09/30/13 1710

## 2013-10-23 ENCOUNTER — Telehealth: Payer: Self-pay | Admitting: Neurology

## 2013-10-23 NOTE — Telephone Encounter (Signed)
Office note faxed to [phone] with confirmation received.

## 2013-10-23 NOTE — Telephone Encounter (Signed)
Message copied by Silvio PateMCCRACKEN, JADE L on Mon Oct 23, 2013  1:26 PM ------      Message from: Shary DecampLOYE, ANDREA S      Created: Mon Oct 23, 2013  1:04 PM      Regarding: Mardene CelesteJoanna from Dr. Lanora ManisElizabeth Dewey's office       Contact: [phone]       Mardene CelesteJoanna from Dr. Chauncy Passyewey's office called requesting the office notes for Pt's DOS 09/16/13.            Please fax over the notes to [phone] ------

## 2013-12-18 ENCOUNTER — Ambulatory Visit (INDEPENDENT_AMBULATORY_CARE_PROVIDER_SITE_OTHER)

## 2013-12-18 ENCOUNTER — Encounter: Payer: Self-pay | Admitting: Podiatry

## 2013-12-18 ENCOUNTER — Ambulatory Visit (INDEPENDENT_AMBULATORY_CARE_PROVIDER_SITE_OTHER): Admitting: Podiatry

## 2013-12-18 ENCOUNTER — Ambulatory Visit: Admitting: Neurology

## 2013-12-18 VITALS — BP 128/76 | HR 75 | Resp 16

## 2013-12-18 DIAGNOSIS — M898X9 Other specified disorders of bone, unspecified site: Secondary | ICD-10-CM

## 2013-12-18 DIAGNOSIS — L600 Ingrowing nail: Secondary | ICD-10-CM

## 2013-12-18 NOTE — Patient Instructions (Signed)

## 2013-12-18 NOTE — Progress Notes (Signed)
Subjective:     Patient ID: Taylor Gomez, female   DOB: 10/07/1990, 23 y.o.   MRN: [id number]  HPI patient presents stating she has a lot of pain at the end of her big toe left and it seems like it's gradually getting worse and making it hard to wear shoes. 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 Patient wants the surgery understanding risk and today I infiltrated 60 mg Xylocaine Marcaine mixture remove the nail in its entirety exposed matrix and applied phenol 5 applications 30 seconds followed by alcohol lavaged and sterile dressing area gave instructions on soaks and reappoint

## 2013-12-19 ENCOUNTER — Ambulatory Visit: Admitting: Neurology

## 2013-12-20 ENCOUNTER — Telehealth: Payer: Self-pay | Admitting: Neurology

## 2013-12-20 NOTE — Telephone Encounter (Signed)
Pt no showed 12/19/13 appt w/ Dr. Arbutus Leasat. Appt was verbally confirmed with pt during reminder calls.  Alcario DroughtErica -please send pt a no show letter / Oneita KrasSherri S.

## 2013-12-21 ENCOUNTER — Encounter: Payer: Self-pay | Admitting: *Deleted

## 2013-12-21 NOTE — Progress Notes (Signed)
No show sent for 12/19/2013

## 2013-12-26 ENCOUNTER — Encounter: Payer: Self-pay | Admitting: *Deleted

## 2013-12-26 NOTE — Progress Notes (Signed)
No show letter sent for 12/19/2013 

## 2013-12-27 ENCOUNTER — Ambulatory Visit (INDEPENDENT_AMBULATORY_CARE_PROVIDER_SITE_OTHER): Admitting: Podiatry

## 2013-12-27 ENCOUNTER — Encounter: Payer: Self-pay | Admitting: Podiatry

## 2013-12-27 VITALS — BP 128/76 | HR 75 | Resp 16

## 2013-12-27 DIAGNOSIS — L03032 Cellulitis of left toe: Secondary | ICD-10-CM

## 2013-12-27 NOTE — Progress Notes (Signed)
Subjective:     Patient ID: Taylor Gomez, female   DOB: 1990/12/29, 23 y.o.   MRN: [id number]  HPI patient states I was concerned because I'm still having pain in my left big toe after removal of nail   Review of Systems     Objective:   Physical Exam Neurovascular status intact with mild drainage on the dorsal surface with patient on antibiotic after seen her family physician in the last few days. No proximal edema erythema drainage noted    Assessment:     Localized paronychia left hallux being treated by antibiotics    Plan:     Advised on continued soaks and allowing air dry to occur at home with utilization of Band-Aids went out. This should heal uneventfully

## 2014-02-05 ENCOUNTER — Ambulatory Visit: Admitting: Podiatry

## 2014-02-21 NOTE — Telephone Encounter (Signed)
error 

## 2014-03-26 ENCOUNTER — Emergency Department (HOSPITAL_COMMUNITY)

## 2014-03-26 ENCOUNTER — Emergency Department (HOSPITAL_COMMUNITY)
Admission: EM | Admit: 2014-03-26 | Discharge: 2014-03-26 | Disposition: A | Discharge status: Home / Self Care | Attending: Emergency Medicine | Admitting: Emergency Medicine

## 2014-03-26 ENCOUNTER — Encounter (HOSPITAL_COMMUNITY): Payer: Self-pay | Admitting: Emergency Medicine

## 2014-03-26 DIAGNOSIS — R05 Cough: Secondary | ICD-10-CM | POA: Diagnosis present

## 2014-03-26 DIAGNOSIS — Z79899 Other long term (current) drug therapy: Secondary | ICD-10-CM | POA: Insufficient documentation

## 2014-03-26 DIAGNOSIS — Z8659 Personal history of other mental and behavioral disorders: Secondary | ICD-10-CM | POA: Diagnosis not present

## 2014-03-26 DIAGNOSIS — J45901 Unspecified asthma with (acute) exacerbation: Secondary | ICD-10-CM | POA: Diagnosis not present

## 2014-03-26 DIAGNOSIS — Z7951 Long term (current) use of inhaled steroids: Secondary | ICD-10-CM | POA: Insufficient documentation

## 2014-03-26 DIAGNOSIS — J45909 Unspecified asthma, uncomplicated: Secondary | ICD-10-CM

## 2014-03-26 DIAGNOSIS — E079 Disorder of thyroid, unspecified: Secondary | ICD-10-CM | POA: Insufficient documentation

## 2014-03-26 DIAGNOSIS — R630 Anorexia: Secondary | ICD-10-CM | POA: Diagnosis not present

## 2014-03-26 DIAGNOSIS — J40 Bronchitis, not specified as acute or chronic: Secondary | ICD-10-CM

## 2014-03-26 LAB — I-STAT CHEM 8, ED
BUN: 11 mg/dL (ref 6–23)
CALCIUM ION: 1.21 mmol/L (ref 1.12–1.23)
Chloride: 104 mmol/L (ref 96–112)
Creatinine, Ser: 0.7 mg/dL (ref 0.50–1.10)
GLUCOSE: 117 mg/dL — AB (ref 70–99)
HCT: 41 % (ref 36.0–46.0)
Hemoglobin: 13.9 g/dL (ref 12.0–15.0)
Potassium: 3.8 mmol/L (ref 3.5–5.1)
Sodium: 142 mmol/L (ref 135–145)
TCO2: 22 mmol/L (ref 0–100)

## 2014-03-26 MED ORDER — IPRATROPIUM BROMIDE 0.02 % IN SOLN
0.5000 mg | Freq: Once | RESPIRATORY_TRACT | Status: AC
  Administered 2014-03-26: 0.5 mg via RESPIRATORY_TRACT
  Filled 2014-03-26: qty 2.5

## 2014-03-26 MED ORDER — LORATADINE 10 MG PO TABS: 10.0000 mg | ORAL_TABLET | Freq: Every day | ORAL | Status: AC

## 2014-03-26 MED ORDER — PREDNISONE 10 MG PO TABS: 20.0000 mg | ORAL_TABLET | Freq: Every day | ORAL | Status: DC

## 2014-03-26 MED ORDER — ALBUTEROL SULFATE (2.5 MG/3ML) 0.083% IN NEBU
5.0000 mg | INHALATION_SOLUTION | Freq: Once | RESPIRATORY_TRACT | Status: AC
  Administered 2014-03-26: 5 mg via RESPIRATORY_TRACT
  Filled 2014-03-26: qty 6

## 2014-03-26 MED ORDER — DOXYCYCLINE HYCLATE 100 MG PO CAPS: 100.0000 mg | ORAL_CAPSULE | Freq: Two times a day (BID) | ORAL | Status: DC

## 2014-03-26 MED ORDER — PREDNISONE 20 MG PO TABS
60.0000 mg | ORAL_TABLET | Freq: Once | ORAL | Status: AC
  Administered 2014-03-26: 60 mg via ORAL
  Filled 2014-03-26: qty 3

## 2014-03-26 MED ORDER — ALBUTEROL SULFATE HFA 108 (90 BASE) MCG/ACT IN AERS
2.0000 | INHALATION_SPRAY | RESPIRATORY_TRACT | Status: DC | PRN
  Administered 2014-03-26: 2 via RESPIRATORY_TRACT
  Filled 2014-03-26: qty 6.7

## 2014-03-26 MED ORDER — GUAIFENESIN-CODEINE 100-10 MG/5ML PO SOLN
5.0000 mL | Freq: Once | ORAL | Status: AC
  Administered 2014-03-26: 5 mL via ORAL
  Filled 2014-03-26: qty 5

## 2014-03-26 NOTE — Discharge Instructions (Signed)
Bronchospasm °A bronchospasm is a spasm or tightening of the airways going into the lungs. During a bronchospasm breathing becomes more difficult because the airways get smaller. When this happens there can be coughing, a whistling sound when breathing (wheezing), and difficulty breathing. Bronchospasm is often associated with asthma, but not all patients who experience a bronchospasm have asthma. °CAUSES  °A bronchospasm is caused by inflammation or irritation of the airways. The inflammation or irritation may be triggered by:  °· Allergies (such as to animals, pollen, food, or mold). Allergens that cause bronchospasm may cause wheezing immediately after exposure or many hours later.   °· Infection. Viral infections are believed to be the most common cause of bronchospasm.   °· Exercise.   °· Irritants (such as pollution, cigarette smoke, strong odors, aerosol sprays, and paint fumes).   °· Weather changes. Winds increase molds and pollens in the air. Rain refreshes the air by washing irritants out. Cold air may cause inflammation.   °· Stress and emotional upset.   °SIGNS AND SYMPTOMS  °· Wheezing.   °· Excessive nighttime coughing.   °· Frequent or severe coughing with a simple cold.   °· Chest tightness.   °· Shortness of breath.   °DIAGNOSIS  °Bronchospasm is usually diagnosed through a history and physical exam. Tests, such as chest X-rays, are sometimes done to look for other conditions. °TREATMENT  °· Inhaled medicines can be given to open up your airways and help you breathe. The medicines can be given using either an inhaler or a nebulizer machine. °· Corticosteroid medicines may be given for severe bronchospasm, usually when it is associated with asthma. °HOME CARE INSTRUCTIONS  °· Always have a plan prepared for seeking medical care. Know when to call your health care provider and local emergency services (911 in the [place] Know where you can access local emergency care. °· Only take medicines as  directed by your health care provider. °· If you were prescribed an inhaler or nebulizer machine, ask your health care provider to explain how to use it correctly. Always use a spacer with your inhaler if you were given one. °· It is necessary to remain calm during an attack. Try to relax and breathe more slowly.  °· Control your home environment in the following ways:   °¨ Change your heating and air conditioning filter at least once a month.   °¨ Limit your use of fireplaces and wood stoves. °¨ Do not smoke and do not allow smoking in your home.   °¨ Avoid exposure to perfumes and fragrances.   °¨ Get rid of pests (such as roaches and mice) and their droppings.   °¨ Throw away plants if you see mold on them.   °¨ Keep your house clean and dust free.   °¨ Replace carpet with wood, tile, or vinyl flooring. Carpet can trap dander and dust.   °¨ Use allergy-proof pillows, mattress covers, and box spring covers.   °¨ Wash bed sheets and blankets every week in hot water and dry them in a dryer.   °¨ Use blankets that are made of polyester or cotton.   °¨ Wash hands frequently. °SEEK MEDICAL CARE IF:  °· You have muscle aches.   °· You have chest pain.   °· The sputum changes from clear or white to yellow, green, gray, or bloody.   °· The sputum you cough up gets thicker.   °· There are problems that may be related to the medicine you are given, such as a rash, itching, swelling, or trouble breathing.   °SEEK IMMEDIATE MEDICAL CARE IF:  °· You have worsening wheezing and coughing even   after taking your prescribed medicines.   °· You have increased difficulty breathing.   °· You develop severe chest pain. °MAKE SURE YOU:  °· Understand these instructions. °· Will watch your condition. °· Will get help right away if you are not doing well or get worse. °Document Released: 01/01/2003 Document Revised: 01/03/2013 Document Reviewed: 06/20/2012 °Exit[employer]® Patient Information ©2015 [employer]. This information is not  intended to replace advice given to you by your health care provider. Make sure you discuss any questions you have with your health care provider. ° °

## 2014-03-26 NOTE — ED Provider Notes (Signed)
CSN: [id number]     Arrival date & time 03/26/14  1545 History  This chart was scribed for non-physician practitioner, Marlon Peliffany Johnathan Heskett, working with Rolan BuccoMelanie Belfi, MD by Richarda Overlieichard Holland, ED Scribe. This patient was seen in room TR11C/TR11C and the patient's care was started at 6:25 PM.   Chief Complaint  Patient presents with  . Cough   The history is provided by the patient. No language interpreter was used.   HPI Comments: Taylor Gomez is a 24 y.o. female with a history of seasonal allergies who presents to the Emergency Department complaining of an unproductive cough for the last 2 months. She states she was seen 2 months ago and was advised to take delsym and was prescribed azithromycin. Pt states that her symptoms did not change after medications. Pt reports that her cough worsened 4 days ago and says that she does not feel like there is anything in the back of her throat but gets the sensation to cough from deep in her lungs. Pt reports that she has felt weak since that time and reports a decreased appetite as well. Pt states that she can not lie completely flat at night due to discomfort. She states she was traveling the last 12 days and was given an albuterol inhaler from a doctor on her trip but says it did not provide her relief. She denies lower extremity swelling, weight loss, CP, SOB, heart racing.    Past Medical History  Diagnosis Date  . Seasonal allergies   . Thyroid disease   . Anxiety    Past Surgical History  Procedure Laterality Date  . Dental surgery     No family history on file. History  Substance Use Topics  . Smoking status: Never Smoker   . Smokeless tobacco: Not on file  . Alcohol Use: No   OB History    No data available     Review of Systems  Constitutional: Positive for appetite change.  Respiratory: Positive for cough.   Cardiovascular: Negative for leg swelling.    Allergies  Review of patient's allergies indicates no known allergies.  Home  Medications   Prior to Admission medications   Medication Sig Start Date End Date Taking? Authorizing Provider  budesonide-formoterol (SYMBICORT) 160-4.5 MCG/ACT inhaler Inhale 2 puffs into the lungs 2 (two) times daily.   Yes Historical Provider, MD  diphenhydrAMINE (BENADRYL) 25 MG tablet Take 25 mg by mouth at bedtime.   Yes Historical Provider, MD  levothyroxine (SYNTHROID, LEVOTHROID) 100 MCG tablet Take 100 mcg by mouth daily before breakfast.   Yes Historical Provider, MD  montelukast (SINGULAIR) 10 MG tablet Take 10 mg by mouth at bedtime.   Yes Historical Provider, MD  naproxen (NAPROSYN) 500 MG tablet Take 1 tablet (500 mg total) by mouth 2 (two) times daily. Patient taking differently: Take 500 mg by mouth daily as needed for mild pain or headache.  09/29/13  Yes Antony MaduraKelly Humes, PA-C  topiramate (TOPAMAX) 100 MG tablet Take 1 tablet (100 mg total) by mouth daily. 09/15/13  Yes Rebecca S Tat, DO  doxycycline (VIBRAMYCIN) 100 MG capsule Take 1 capsule (100 mg total) by mouth 2 (two) times daily. 03/26/14   Shanielle Correll Neva SeatGreene, PA-C  loratadine (CLARITIN) 10 MG tablet Take 1 tablet (10 mg total) by mouth daily. 03/26/14   Marlon Peliffany Santanna Whitford, PA-C  oxyCODONE-acetaminophen (PERCOCET/ROXICET) 5-325 MG per tablet Take 1-2 tablets by mouth every 6 (six) hours as needed for moderate pain or severe pain. Patient not taking: Reported on  03/26/2014 09/29/13   Antony Madura, PA-C  predniSONE (DELTASONE) 10 MG tablet Take 2 tablets (20 mg total) by mouth daily. 03/26/14   Sandy Haye Neva Seat, PA-C  topiramate (TOPAMAX) 25 MG tablet Take 1 tablet by mouth for one week, then 2 tablets by mouth for 1 week, then 3 tablets by mouth for 1 week then switch to 100 mg tablets Patient not taking: Reported on 03/26/2014 09/15/13   Lurena Joiner S Tat, DO   BP 133/84 mmHg  Pulse 85  Temp(Src) 98.7 F (37.1 C) (Oral)  Resp 20  Ht 5' 7.5" (1.715 m)  Wt 212 lb 1 oz (96.191 kg)  BMI 32.70 kg/m2  SpO2 99%  LMP 03/21/2014 (Exact Date) Physical  Exam  Constitutional: She is oriented to person, place, and time. She appears well-developed and well-nourished.  HENT:  Head: Normocephalic and atraumatic.  Eyes: Right eye exhibits no discharge. Left eye exhibits no discharge.  Neck: Neck supple. No tracheal deviation present.  Cardiovascular: Normal rate.   Pulmonary/Chest: Effort normal. No accessory muscle usage. She has decreased breath sounds (mildly decreased breath sounds). She has wheezes (mild). She has no rhonchi. She has no rales. She exhibits no tenderness, no bony tenderness and no crepitus.  Bronchial spasm when coughing.   Abdominal: She exhibits no distension.  Musculoskeletal:  No lower extremity swelling or rash  Neurological: She is alert and oriented to person, place, and time.  Skin: Skin is warm and dry.  Psychiatric: She has a normal mood and affect. Her behavior is normal.  Nursing note and vitals reviewed.   ED Course  Procedures   DIAGNOSTIC STUDIES: Oxygen Saturation is 99% on RA, normal by my interpretation.    COORDINATION OF CARE: 6:33 PM Discussed treatment plan with pt at bedside and pt agreed to plan. Discussed x-ray results with pt. Will administer nebulizer breathing treatment and prescribe steroid medication.   8:46 PM Pt reports that her symptoms have improved since receiving the breathing treatment. She says that she feels like she can breath better when she is laying down as well. Patient's O2 saturation is 100% with ambulation. Will prescribe Abx, prednisone, claritin and an albuterol inhaler. Reccommended pt to make an appointment with her PCP in a few weeks in case her symptoms have not improved.   Labs Review Labs Reviewed  I-STAT CHEM 8, ED - Abnormal; Notable for the following:    Glucose, Bld 117 (*)    All other components within normal limits   Imaging Review Dg Chest 2 View  03/26/2014   CLINICAL DATA:  Cough, congestion  EXAM: CHEST  2 VIEW  COMPARISON:  None.  FINDINGS: The  heart size and mediastinal contours are within normal limits. Both lungs are clear. The visualized skeletal structures are unremarkable.  IMPRESSION: No active cardiopulmonary disease.   Electronically Signed   By: Charlett Nose M.D.   On: 03/26/2014 16:41     EKG Interpretation None      MDM   Final diagnoses:  Bronchitis  Asthma, unspecified asthma severity, uncomplicated   Medications  albuterol (PROVENTIL HFA;VENTOLIN HFA) 108 (90 BASE) MCG/ACT inhaler 2 puff (not administered)  albuterol (PROVENTIL) (2.5 MG/3ML) 0.083% nebulizer solution 5 mg (5 mg Nebulization Given 03/26/14 1913)  ipratropium (ATROVENT) nebulizer solution 0.5 mg (0.5 mg Nebulization Given 03/26/14 1913)  predniSONE (DELTASONE) tablet 60 mg (60 mg Oral Given 03/26/14 1913)  guaiFENesin-codeine 100-10 MG/5ML solution 5 mL (5 mLs Oral Given 03/26/14 1913)   Patient feeling significantly better after  breathing treatment and medications. I ambulated patient with pulse ox and her saturation stayed between 99 and 100% on room air. Patient has a primary care doctor who she needs to follow up with. I will write her for antibiotic, Claritin, prednisone and albuterol inhaler. If she is not improving that she may need a more in depth and thorough workup to rule out any sort of underlying disease. She is PERC negative for PE.  23 y.o.Taylor Gomez's evaluation in the Emergency Department is complete. It has been determined that no acute conditions requiring further emergency intervention are present at this time. The patient/guardian have been advised of the diagnosis and plan. We have discussed signs and symptoms that warrant return to the ED, such as changes or worsening in symptoms.  Vital signs are stable at discharge. Filed Vitals:   03/26/14 2106  BP: 119/73  Pulse: 96  Temp: 97.9 F (36.6 C)  Resp: 13    Patient/guardian has voiced understanding and agreed to follow-up with the PCP or specialist.      Marlon Pel,  PA-C 03/26/14 2139  Rolan Bucco, MD 03/26/14 2308

## 2014-03-26 NOTE — ED Notes (Signed)
Pt reports dry cough for 2 months and has been seen for same and placed on allergy medicine and inhaler. Over past few days cough has progressively gotten worse. Pt concerned for pneumonia-( has had it once before ). sts occasionally she will cough up mucous with streaks of blood. Also endorses chills. Skin warm and dry.

## 2014-05-29 ENCOUNTER — Other Ambulatory Visit: Payer: Self-pay | Admitting: Family Medicine

## 2014-05-29 DIAGNOSIS — R109 Unspecified abdominal pain: Secondary | ICD-10-CM

## 2014-05-31 ENCOUNTER — Ambulatory Visit
Admission: RE | Admit: 2014-05-31 | Discharge: 2014-05-31 | Disposition: A | Discharge status: Home / Self Care | Payer: BLUE CROSS/BLUE SHIELD | Source: Ambulatory Visit | Attending: Family Medicine | Admitting: Family Medicine

## 2014-05-31 DIAGNOSIS — R109 Unspecified abdominal pain: Secondary | ICD-10-CM

## 2014-07-01 ENCOUNTER — Emergency Department (HOSPITAL_COMMUNITY)
Admission: EM | Admit: 2014-07-01 | Discharge: 2014-07-01 | Disposition: A | Discharge status: Home / Self Care | Payer: BLUE CROSS/BLUE SHIELD | Attending: Emergency Medicine | Admitting: Emergency Medicine

## 2014-07-01 ENCOUNTER — Emergency Department (HOSPITAL_COMMUNITY): Payer: BLUE CROSS/BLUE SHIELD

## 2014-07-01 ENCOUNTER — Encounter (HOSPITAL_COMMUNITY): Payer: Self-pay | Admitting: Emergency Medicine

## 2014-07-01 DIAGNOSIS — Z7951 Long term (current) use of inhaled steroids: Secondary | ICD-10-CM | POA: Diagnosis not present

## 2014-07-01 DIAGNOSIS — R319 Hematuria, unspecified: Secondary | ICD-10-CM | POA: Diagnosis not present

## 2014-07-01 DIAGNOSIS — Z3202 Encounter for pregnancy test, result negative: Secondary | ICD-10-CM | POA: Diagnosis not present

## 2014-07-01 DIAGNOSIS — Z8659 Personal history of other mental and behavioral disorders: Secondary | ICD-10-CM | POA: Insufficient documentation

## 2014-07-01 DIAGNOSIS — R1110 Vomiting, unspecified: Secondary | ICD-10-CM | POA: Insufficient documentation

## 2014-07-01 DIAGNOSIS — R0602 Shortness of breath: Secondary | ICD-10-CM | POA: Diagnosis present

## 2014-07-01 DIAGNOSIS — E079 Disorder of thyroid, unspecified: Secondary | ICD-10-CM | POA: Diagnosis not present

## 2014-07-01 DIAGNOSIS — Z79899 Other long term (current) drug therapy: Secondary | ICD-10-CM | POA: Diagnosis not present

## 2014-07-01 DIAGNOSIS — J45901 Unspecified asthma with (acute) exacerbation: Secondary | ICD-10-CM | POA: Insufficient documentation

## 2014-07-01 HISTORY — DX: Unspecified asthma, uncomplicated: J45.909

## 2014-07-01 LAB — URINALYSIS, ROUTINE W REFLEX MICROSCOPIC
BILIRUBIN URINE: NEGATIVE
Glucose, UA: NEGATIVE mg/dL
KETONES UR: NEGATIVE mg/dL
LEUKOCYTES UA: NEGATIVE
NITRITE: NEGATIVE
PROTEIN: NEGATIVE mg/dL
Specific Gravity, Urine: 1.014 (ref 1.005–1.030)
UROBILINOGEN UA: 0.2 mg/dL (ref 0.0–1.0)
pH: 6.5 (ref 5.0–8.0)

## 2014-07-01 LAB — URINE MICROSCOPIC-ADD ON

## 2014-07-01 LAB — POC URINE PREG, ED: Preg Test, Ur: NEGATIVE

## 2014-07-01 MED ORDER — IBUPROFEN 800 MG PO TABS
800.0000 mg | ORAL_TABLET | Freq: Once | ORAL | Status: AC
  Administered 2014-07-01: 800 mg via ORAL
  Filled 2014-07-01: qty 1

## 2014-07-01 MED ORDER — IPRATROPIUM-ALBUTEROL 0.5-2.5 (3) MG/3ML IN SOLN
RESPIRATORY_TRACT | Status: AC
  Filled 2014-07-01: qty 3

## 2014-07-01 NOTE — Discharge Instructions (Signed)

## 2014-07-01 NOTE — ED Notes (Signed)
MD at bedside. 

## 2014-07-01 NOTE — ED Notes (Signed)
This RN ambulated with pt. Pt tolerated well, not appearing to be short of breath. Pt's O2 sats maintained 99-100% on RA while ambulating.

## 2014-07-01 NOTE — ED Provider Notes (Signed)
CSN: [id number]     Arrival date & time 07/01/14  0134 History   First MD Initiated Contact with Patient 07/01/14 0435     Chief Complaint  Patient presents with  . Asthma     Patient is a 24 y.o. female presenting with shortness of breath. The history is provided by the patient.  Shortness of Breath Severity:  Moderate Onset quality:  Gradual Duration:  2 days Timing:  Intermittent Progression:  Worsening Chronicity:  Recurrent Relieved by:  Nothing Worsened by:  Activity Associated symptoms: vomiting   Associated symptoms: no abdominal pain, no chest pain, no fever and no hemoptysis   Risk factors: no hx of PE/DVT, no oral contraceptive use, no prolonged immobilization and no tobacco use   PT reports h/o asthma She uses allergy meds frequently as well as albuterol/symbicort She reports for past 2 days she has had increased cough and SOB No CP No pleuritic pain No hemoptysis She reports tonight her SOB/wheezing worsened and did not improve with nebs She reports due to feeling SOB she had brief episode of LOC but no injury from fall No seizure She is now improved She recently completed prednisone for asthma She reports dyspnea on exertion as well No h/o CAD/DVT/PE  Past Medical History  Diagnosis Date  . Seasonal allergies   . Thyroid disease   . Anxiety   . Asthma    Past Surgical History  Procedure Laterality Date  . Dental surgery     No family history on file. History  Substance Use Topics  . Smoking status: Never Smoker   . Smokeless tobacco: Not on file  . Alcohol Use: No   OB History    No data available     Review of Systems  Constitutional: Negative for fever.  Respiratory: Positive for shortness of breath. Negative for hemoptysis.   Cardiovascular: Negative for chest pain.  Gastrointestinal: Positive for vomiting. Negative for abdominal pain.  Genitourinary: Positive for hematuria.  Neurological: Negative for seizures.  All other systems  reviewed and are negative.     Allergies  Review of patient's allergies indicates no known allergies.  Home Medications   Prior to Admission medications   Medication Sig Start Date End Date Taking? Authorizing Provider  budesonide-formoterol (SYMBICORT) 160-4.5 MCG/ACT inhaler Inhale 2 puffs into the lungs 2 (two) times daily.    Historical Provider, MD  diphenhydrAMINE (BENADRYL) 25 MG tablet Take 25 mg by mouth at bedtime.    Historical Provider, MD  levothyroxine (SYNTHROID, LEVOTHROID) 100 MCG tablet Take 100 mcg by mouth daily before breakfast.    Historical Provider, MD  loratadine (CLARITIN) 10 MG tablet Take 1 tablet (10 mg total) by mouth daily. 03/26/14   Tiffany Neva Seat, PA-C  montelukast (SINGULAIR) 10 MG tablet Take 10 mg by mouth at bedtime.    Historical Provider, MD  naproxen (NAPROSYN) 500 MG tablet Take 1 tablet (500 mg total) by mouth 2 (two) times daily. Patient taking differently: Take 500 mg by mouth daily as needed for mild pain or headache.  09/29/13   Antony Madura, PA-C   BP 124/76 mmHg  Pulse 91  Temp(Src) 98.5 F (36.9 C)  Resp 12  SpO2 100%  LMP 03/31/2014 Physical Exam CONSTITUTIONAL: Well developed/well nourished HEAD: Normocephalic/atraumatic EYES: EOMI/PERRL ENMT: Mucous membranes moist NECK: supple no meningeal signs SPINE/BACK:entire spine nontender CV: S1/S2 noted, no murmurs/rubs/gallops noted LUNGS: Lungs are clear to auscultation bilaterally, no apparent distress ABDOMEN: soft, nontender, no rebound or guarding, bowel sounds noted  throughout abdomen GU:no cva tenderness NEURO: Pt is awake/alert/appropriate, moves all extremitiesx4.  No facial droop.   EXTREMITIES: pulses normal/equal, full ROM, no LE edema/tenderness SKIN: warm, color normal PSYCH: no abnormalities of mood noted, alert and oriented to situation  ED Course  Procedures   5:04 AM Lungs clear No hypoxia Will ambulate  Will check EKG (brief syncopal episode, denies  previous h/o cardiac disease or previous syncope) Will check u/a due to recent hematuria   Pt improved  she ambulated without hypoxia and denied dyspnea EKG unremarkable She is well appearing and appropriate for d/c home Referred back to PCP and also pulmology I doubt PE (no hypoxia or CP) I doubt dysrhythmia as cause of near syncope earlier in the night  Labs Review Labs Reviewed  URINALYSIS, ROUTINE W REFLEX MICROSCOPIC (NOT AT [facility]) - Abnormal; Notable for the following:    Hgb urine dipstick MODERATE (*)    All other components within normal limits  URINE MICROSCOPIC-ADD ON  POC URINE PREG, ED    Imaging Review Dg Chest 2 View  07/01/2014   CLINICAL DATA:  24 year old female status post asthma attack. Productive cough and congestion. Initial encounter.  EXAM: CHEST  2 VIEW  COMPARISON:  03/26/2014.  FINDINGS: Normal lung volumes. Normal cardiac size and mediastinal contours. Visualized tracheal air column is within normal limits. The lungs are clear. No pneumothorax or effusion. No osseous abnormality identified.  IMPRESSION: Negative, no acute cardiopulmonary abnormality.   Electronically Signed   By: Odessa Fleming M.D.   On: 07/01/2014 02:28     EKG Interpretation   Date/Time:  Sunday July 01 2014 05:16:44 EDT Ventricular Rate:  82 PR Interval:  128 QRS Duration: 87 QT Interval:  353 QTC Calculation: 412 R Axis:   58 Text Interpretation:  Sinus rhythm Normal ECG No previous ECGs available  Confirmed by Bebe Shaggy  MD, Dorinda Hill ([id number]) on 07/01/2014 5:49:35 AM      MDM   Final diagnoses:  Asthma attack    Nursing notes including past medical history and social history reviewed and considered in documentation Labs/vital reviewed myself and considered during evaluation xrays/imaging reviewed by myself and considered during evaluation      Zadie Rhine, MD 07/01/14 [phone]

## 2014-07-01 NOTE — ED Notes (Addendum)
Pt reports trouble breathing x 2 days. Tonight sob increased. Pt has had 3 breathing tx pta. Pt breathing rapidly in triage. Lung sounds clear. o2 sats 100%. Pt does report some anxiety from albuterol. Pt reassured and breathing pattern has slowed down. sts more sob with exertion.

## 2014-10-03 ENCOUNTER — Other Ambulatory Visit: Payer: Self-pay | Admitting: Family Medicine

## 2014-10-03 DIAGNOSIS — N926 Irregular menstruation, unspecified: Secondary | ICD-10-CM

## 2014-10-03 DIAGNOSIS — R102 Pelvic and perineal pain: Secondary | ICD-10-CM

## 2014-10-05 ENCOUNTER — Ambulatory Visit
Admission: RE | Admit: 2014-10-05 | Discharge: 2014-10-05 | Disposition: A | Discharge status: Home / Self Care | Payer: BLUE CROSS/BLUE SHIELD | Source: Ambulatory Visit | Attending: Family Medicine | Admitting: Family Medicine

## 2014-10-05 DIAGNOSIS — N926 Irregular menstruation, unspecified: Secondary | ICD-10-CM

## 2014-10-05 DIAGNOSIS — R102 Pelvic and perineal pain: Secondary | ICD-10-CM

## 2015-07-29 ENCOUNTER — Ambulatory Visit (INDEPENDENT_AMBULATORY_CARE_PROVIDER_SITE_OTHER)

## 2015-07-29 ENCOUNTER — Ambulatory Visit (INDEPENDENT_AMBULATORY_CARE_PROVIDER_SITE_OTHER): Admitting: Podiatry

## 2015-07-29 DIAGNOSIS — R52 Pain, unspecified: Secondary | ICD-10-CM

## 2015-07-29 DIAGNOSIS — B070 Plantar wart: Secondary | ICD-10-CM

## 2015-07-29 DIAGNOSIS — B078 Other viral warts: Secondary | ICD-10-CM

## 2015-07-29 DIAGNOSIS — M779 Enthesopathy, unspecified: Secondary | ICD-10-CM

## 2015-07-29 DIAGNOSIS — B079 Viral wart, unspecified: Secondary | ICD-10-CM

## 2015-07-29 MED ORDER — DICLOFENAC SODIUM 75 MG PO TBEC: 75.0000 mg | DELAYED_RELEASE_TABLET | Freq: Two times a day (BID) | ORAL | Status: DC

## 2015-07-29 NOTE — Progress Notes (Signed)
   Subjective:    Patient ID: Taylor Gomez, female    DOB: Jan 15, 1990, 25 y.o.   MRN: [id number]  HPI I have 5 warts on the bottom of my left foot and a throbbing pain in the center top of both feet    Review of Systems  All other systems reviewed and are negative.      Objective:   Physical Exam        Assessment & Plan:

## 2015-07-31 NOTE — Progress Notes (Signed)
Subjective:     Patient ID: Taylor Gomez, female   DOB: 1990-02-13, 25 y.o.   MRN: [id number]  HPI patient presents with painful lesions plantar aspect left foot and left second toe that she states that she's had for a while. Also complains with inflamed capsule left foot   Review of Systems     Objective:   Physical Exam Neurovascular status intact with normal range of motion and found to have 5 distinct keratotic lesions plantar aspect left that upon debridement show pinpoint bleeding and pain to lateral pressure. Patient also has inflammation around the second MPJ left with fluid buildup    Assessment:     2 separate problems with one being capsulitis and second being inflammatory verruca plantaris    Plan:     H&P reviewed both conditions and today I debrided all lesions left I went ahead and I applied chemical agent to kill the root and explained what to do if it should blister or form any other issues. Reappoint 4 weeks or earlier if needed

## 2015-08-26 ENCOUNTER — Ambulatory Visit (INDEPENDENT_AMBULATORY_CARE_PROVIDER_SITE_OTHER): Admitting: Podiatry

## 2015-08-26 DIAGNOSIS — M779 Enthesopathy, unspecified: Secondary | ICD-10-CM

## 2015-08-26 DIAGNOSIS — B070 Plantar wart: Secondary | ICD-10-CM | POA: Diagnosis not present

## 2015-08-26 DIAGNOSIS — B078 Other viral warts: Secondary | ICD-10-CM

## 2015-08-26 DIAGNOSIS — B079 Viral wart, unspecified: Secondary | ICD-10-CM

## 2015-08-26 MED ORDER — TRIAMCINOLONE ACETONIDE 10 MG/ML IJ SUSP
10.0000 mg | Freq: Once | INTRAMUSCULAR | Status: AC
  Administered 2015-08-26: 10 mg

## 2015-08-27 NOTE — Progress Notes (Signed)
Subjective:     Patient ID: Taylor Gomez, female   DOB: [birth year], 25 y.o.   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

## 2015-09-23 ENCOUNTER — Ambulatory Visit: Admitting: Podiatry

## 2015-10-17 ENCOUNTER — Ambulatory Visit: Admitting: Podiatry

## 2015-11-18 ENCOUNTER — Encounter (HOSPITAL_COMMUNITY): Payer: Self-pay

## 2015-11-18 ENCOUNTER — Emergency Department (HOSPITAL_COMMUNITY)
Admission: EM | Admit: 2015-11-18 | Discharge: 2015-11-18 | Disposition: A | Discharge status: Home / Self Care | Payer: BLUE CROSS/BLUE SHIELD | Attending: Emergency Medicine | Admitting: Emergency Medicine

## 2015-11-18 ENCOUNTER — Emergency Department (HOSPITAL_COMMUNITY): Payer: BLUE CROSS/BLUE SHIELD

## 2015-11-18 DIAGNOSIS — Z79899 Other long term (current) drug therapy: Secondary | ICD-10-CM | POA: Insufficient documentation

## 2015-11-18 DIAGNOSIS — J45909 Unspecified asthma, uncomplicated: Secondary | ICD-10-CM | POA: Diagnosis not present

## 2015-11-18 DIAGNOSIS — K279 Peptic ulcer, site unspecified, unspecified as acute or chronic, without hemorrhage or perforation: Secondary | ICD-10-CM | POA: Diagnosis not present

## 2015-11-18 DIAGNOSIS — R1011 Right upper quadrant pain: Secondary | ICD-10-CM

## 2015-11-18 DIAGNOSIS — R1110 Vomiting, unspecified: Secondary | ICD-10-CM | POA: Diagnosis present

## 2015-11-18 LAB — URINALYSIS, ROUTINE W REFLEX MICROSCOPIC
Bilirubin Urine: NEGATIVE
Glucose, UA: NEGATIVE mg/dL
Hgb urine dipstick: NEGATIVE
Ketones, ur: NEGATIVE mg/dL
Leukocytes, UA: NEGATIVE
Nitrite: NEGATIVE
PROTEIN: NEGATIVE mg/dL
Specific Gravity, Urine: 1.023 (ref 1.005–1.030)
pH: 5.5 (ref 5.0–8.0)

## 2015-11-18 LAB — COMPREHENSIVE METABOLIC PANEL
ALT: 30 U/L (ref 14–54)
ANION GAP: 7 (ref 5–15)
AST: 25 U/L (ref 15–41)
Albumin: 3.8 g/dL (ref 3.5–5.0)
Alkaline Phosphatase: 74 U/L (ref 38–126)
BUN: 5 mg/dL — ABNORMAL LOW (ref 6–20)
CALCIUM: 9.4 mg/dL (ref 8.9–10.3)
CHLORIDE: 105 mmol/L (ref 101–111)
CO2: 26 mmol/L (ref 22–32)
Creatinine, Ser: 0.86 mg/dL (ref 0.44–1.00)
Glucose, Bld: 88 mg/dL (ref 65–99)
Potassium: 4.2 mmol/L (ref 3.5–5.1)
SODIUM: 138 mmol/L (ref 135–145)
Total Bilirubin: 0.7 mg/dL (ref 0.3–1.2)
Total Protein: 7.9 g/dL (ref 6.5–8.1)

## 2015-11-18 LAB — CBC
HCT: 40.4 % (ref 36.0–46.0)
HEMOGLOBIN: 13.1 g/dL (ref 12.0–15.0)
MCH: 25.7 pg — ABNORMAL LOW (ref 26.0–34.0)
MCHC: 32.4 g/dL (ref 30.0–36.0)
MCV: 79.4 fL (ref 78.0–100.0)
PLATELETS: 349 10*3/uL (ref 150–400)
RBC: 5.09 MIL/uL (ref 3.87–5.11)
RDW: 14.3 % (ref 11.5–15.5)
WBC: 6.7 10*3/uL (ref 4.0–10.5)

## 2015-11-18 LAB — POC URINE PREG, ED: PREG TEST UR: NEGATIVE

## 2015-11-18 LAB — LIPASE, BLOOD: LIPASE: 24 U/L (ref 11–51)

## 2015-11-18 MED ORDER — OMEPRAZOLE 20 MG PO CPDR: 20.0000 mg | DELAYED_RELEASE_CAPSULE | Freq: Two times a day (BID) | ORAL | 0 refills | Status: AC

## 2015-11-18 MED ORDER — ALBUTEROL SULFATE HFA 108 (90 BASE) MCG/ACT IN AERS: 1.0000 | INHALATION_SPRAY | RESPIRATORY_TRACT | Status: DC | PRN

## 2015-11-18 NOTE — Discharge Instructions (Signed)
Records only for the next few days.  Slowly introduce easy to digest foods such as breads and cereals, respiratory rate is, yogurt, Rice etc.  [facility] to schedule an outpatient appointment to discuss endoscopy to confirm that your symptoms are from an ulcer.

## 2015-11-18 NOTE — ED Triage Notes (Signed)
Pt states emesis after eating x 12 days. Pt denies any abdominal pain or nausea except when she eats. Pt states lost 14 pounds in 12 days. Pt states LBM today. Pt denies any urinary symptoms. Pt states LMP 2 days ago.

## 2015-11-18 NOTE — ED Notes (Signed)
MD at the bedside  

## 2015-11-18 NOTE — ED Provider Notes (Signed)
[facility]-EMERGENCY DEPT Provider Note   CSN: [id number] Arrival date & time: 11/18/15  1443     History   Chief Complaint Chief Complaint  Patient presents with  . Emesis    HPI Taylor Gomez is a 25 y.o. female. She presents for a complaint of postprandial emesis for the last several weeks. She states 3-4 weeks now of emesis. Quickly after taking by mouth she is able tolerate liquids but almost no solids and his last more than 12 pounds. She states she has some discomfort but really minimal to no pain. States she's always had a bit of a fatty food intolerance. No history of known gallbladder disease. Was raised by her grandparents and does not know her parental history. Has no siblings. No known history in the family of biliary colic or cholelithiasis.  Use tobacco or alcohol. No excessive anti-inflammatories. 2-3 episodes of caffeine per day. This taken a few and antacids with minimal to no relief. Has not been on H2 blocker or PPI.  HPI  Past Medical History:  Diagnosis Date  . Anxiety   . Asthma   . Seasonal allergies   . Thyroid disease     There are no active problems to display for this patient.   Past Surgical History:  Procedure Laterality Date  . DENTAL SURGERY      OB History    No data available       Home Medications    Prior to Admission medications   Medication Sig Start Date End Date Taking? Authorizing Provider  albuterol (PROVENTIL HFA;VENTOLIN HFA) 108 (90 BASE) MCG/ACT inhaler Inhale 1 puff into the lungs every 6 (six) hours as needed for wheezing or shortness of breath.    Historical Provider, MD  albuterol (PROVENTIL) (2.5 MG/3ML) 0.083% nebulizer solution Take 2.5 mg by nebulization every 6 (six) hours as needed for wheezing or shortness of breath.    Historical Provider, MD  budesonide-formoterol (SYMBICORT) 160-4.5 MCG/ACT inhaler Inhale 2 puffs into the lungs 2 (two) times daily.    Historical Provider, MD  diclofenac (VOLTAREN) 75 MG EC tablet  Take 1 tablet (75 mg total) by mouth 2 (two) times daily. 07/29/15   Lenn SinkNorman S Regal, DPM  levothyroxine (SYNTHROID, LEVOTHROID) 100 MCG tablet Take 100 mcg by mouth daily before breakfast.    Historical Provider, MD  loratadine (CLARITIN) 10 MG tablet Take 1 tablet (10 mg total) by mouth daily. Patient not taking: Reported on 07/01/2014 03/26/14   Marlon Peliffany Greene, PA-C  metFORMIN (GLUCOPHAGE-XR) 500 MG 24 hr tablet TK 1 T PO QD 07/10/15   Historical Provider, MD  MICROGESTIN 1.5-30 MG-MCG tablet TK 1 T PO QD 07/10/15   Historical Provider, MD  montelukast (SINGULAIR) 10 MG tablet Take 10 mg by mouth at bedtime.    Historical Provider, MD  naproxen (NAPROSYN) 500 MG tablet Take 1 tablet (500 mg total) by mouth 2 (two) times daily. Patient not taking: Reported on 07/01/2014 09/29/13   Antony MaduraKelly Humes, PA-C    Family History History reviewed. No pertinent family history.  Social History Social History  Substance Use Topics  . Smoking status: Never Smoker  . Smokeless tobacco: Never Used  . Alcohol use No     Allergies   Patient has no known allergies.   Review of Systems Review of Systems  Constitutional: Negative for appetite change, chills, diaphoresis, fatigue and fever.  HENT: Negative for mouth sores, sore throat and trouble swallowing.   Eyes: Negative for visual disturbance.  Respiratory: Negative  for cough, chest tightness, shortness of breath and wheezing.   Cardiovascular: Negative for chest pain.  Gastrointestinal: Positive for abdominal pain, nausea and vomiting. Negative for abdominal distention and diarrhea.  Endocrine: Negative for polydipsia, polyphagia and polyuria.  Genitourinary: Negative for dysuria, frequency and hematuria.  Musculoskeletal: Negative for gait problem.  Skin: Negative for color change, pallor and rash.  Neurological: Negative for dizziness, syncope, light-headedness and headaches.  Hematological: Does not bruise/bleed easily.  Psychiatric/Behavioral:  Negative for behavioral problems and confusion.     Physical Exam Updated Vital Signs BP 111/63 (BP Location: Left Arm)   Pulse 70   Temp 98.1 F (36.7 C) (Oral)   Resp 18   Ht 5' 6.5" (1.689 m)   Wt 216 lb (98 kg)   LMP 11/16/2015   SpO2 97%   BMI 34.34 kg/m   Physical Exam  Constitutional: She is oriented to person, place, and time. She appears well-developed and well-nourished. No distress.  HENT:  Head: Normocephalic.  Eyes: Conjunctivae are normal. Pupils are equal, round, and reactive to light. No scleral icterus.  Neck: Normal range of motion. Neck supple. No thyromegaly present.  Cardiovascular: Normal rate and regular rhythm.  Exam reveals no gallop and no friction rub.   No murmur heard. Pulmonary/Chest: Effort normal and breath sounds normal. No respiratory distress. She has no wheezes. She has no rales.  Abdominal: Soft. Bowel sounds are normal. She exhibits no distension. There is no tenderness. There is no rebound.    Musculoskeletal: Normal range of motion.  Neurological: She is alert and oriented to person, place, and time.  Skin: Skin is warm and dry. No rash noted.  Psychiatric: She has a normal mood and affect. Her behavior is normal.     ED Treatments / Results  Labs (all labs ordered are listed, but only abnormal results are displayed) Labs Reviewed  COMPREHENSIVE METABOLIC PANEL - Abnormal; Notable for the following:       Result Value   BUN 5 (*)    All other components within normal limits  CBC - Abnormal; Notable for the following:    MCH 25.7 (*)    All other components within normal limits  URINALYSIS, ROUTINE W REFLEX MICROSCOPIC (NOT AT [facility]) - Abnormal; Notable for the following:    Color, Urine AMBER (*)    All other components within normal limits  LIPASE, BLOOD  POC URINE PREG, ED    EKG  EKG Interpretation None       Radiology No results found.  Procedures Procedures (including critical care time)  Medications  Ordered in ED Medications - No data to display   Initial Impression / Assessment and Plan / ED Course  I have reviewed the triage vital signs and the nursing notes.  Pertinent labs & imaging results that were available during my care of the patient were reviewed by me and considered in my medical decision making (see chart for details).  Clinical Course     Reassuring labs. Normal right upper quadrant ultrasound. 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 We discussed this at length. We'll introduce VID proton pump inhibitors. Clear liquids. Advance the diet. GI follow-up for endoscopy.  Final Clinical Impressions(s) / ED Diagnoses   Final diagnoses:  RUQ pain    New Prescriptions New Prescriptions   No medications on file     Rolland PorterMark Kaaren Nass, MD 11/18/15 1844

## 2015-11-18 NOTE — ED Notes (Signed)
Patient being taken to US 

## 2015-12-09 ENCOUNTER — Other Ambulatory Visit (HOSPITAL_COMMUNITY): Payer: Self-pay | Admitting: Physician Assistant

## 2015-12-09 ENCOUNTER — Other Ambulatory Visit: Payer: Self-pay | Admitting: Physician Assistant

## 2015-12-09 DIAGNOSIS — R1011 Right upper quadrant pain: Secondary | ICD-10-CM

## 2015-12-09 DIAGNOSIS — R1115 Cyclical vomiting syndrome unrelated to migraine: Secondary | ICD-10-CM

## 2015-12-09 DIAGNOSIS — R1013 Epigastric pain: Secondary | ICD-10-CM

## 2015-12-12 ENCOUNTER — Encounter (HOSPITAL_COMMUNITY): Payer: Self-pay

## 2015-12-12 ENCOUNTER — Encounter (HOSPITAL_COMMUNITY)
Admission: RE | Admit: 2015-12-12 | Discharge: 2015-12-12 | Disposition: A | Discharge status: Home / Self Care | Payer: BLUE CROSS/BLUE SHIELD | Source: Ambulatory Visit | Attending: Physician Assistant | Admitting: Physician Assistant

## 2015-12-12 DIAGNOSIS — R1011 Right upper quadrant pain: Secondary | ICD-10-CM | POA: Diagnosis not present

## 2015-12-12 DIAGNOSIS — G43A1 Cyclical vomiting, intractable: Secondary | ICD-10-CM | POA: Diagnosis not present

## 2015-12-12 DIAGNOSIS — R1013 Epigastric pain: Secondary | ICD-10-CM | POA: Insufficient documentation

## 2015-12-12 DIAGNOSIS — R1115 Cyclical vomiting syndrome unrelated to migraine: Secondary | ICD-10-CM

## 2015-12-12 MED ORDER — TECHNETIUM TC 99M MEBROFENIN IV KIT
5.0000 | PACK | Freq: Once | INTRAVENOUS | Status: AC | PRN
  Administered 2015-12-12: 5.5 via INTRAVENOUS

## 2015-12-12 MED ORDER — SODIUM CHLORIDE 0.9% FLUSH
INTRAVENOUS | Status: AC
  Filled 2015-12-12: qty 120

## 2015-12-17 ENCOUNTER — Ambulatory Visit (HOSPITAL_COMMUNITY): Payer: BLUE CROSS/BLUE SHIELD

## 2016-08-27 ENCOUNTER — Encounter (HOSPITAL_BASED_OUTPATIENT_CLINIC_OR_DEPARTMENT_OTHER): Payer: Self-pay

## 2016-08-27 ENCOUNTER — Emergency Department (HOSPITAL_BASED_OUTPATIENT_CLINIC_OR_DEPARTMENT_OTHER): Payer: Self-pay

## 2016-08-27 ENCOUNTER — Emergency Department (HOSPITAL_BASED_OUTPATIENT_CLINIC_OR_DEPARTMENT_OTHER)
Admission: EM | Admit: 2016-08-27 | Discharge: 2016-08-28 | Disposition: A | Discharge status: Home / Self Care | Payer: Self-pay | Attending: Physician Assistant | Admitting: Physician Assistant

## 2016-08-27 DIAGNOSIS — Z79899 Other long term (current) drug therapy: Secondary | ICD-10-CM | POA: Insufficient documentation

## 2016-08-27 DIAGNOSIS — J45909 Unspecified asthma, uncomplicated: Secondary | ICD-10-CM | POA: Insufficient documentation

## 2016-08-27 DIAGNOSIS — K439 Ventral hernia without obstruction or gangrene: Secondary | ICD-10-CM

## 2016-08-27 DIAGNOSIS — K469 Unspecified abdominal hernia without obstruction or gangrene: Secondary | ICD-10-CM | POA: Insufficient documentation

## 2016-08-27 LAB — COMPREHENSIVE METABOLIC PANEL
ALBUMIN: 3.8 g/dL (ref 3.5–5.0)
ALK PHOS: 68 U/L (ref 38–126)
ALT: 17 U/L (ref 14–54)
AST: 18 U/L (ref 15–41)
Anion gap: 9 (ref 5–15)
BILIRUBIN TOTAL: 0.3 mg/dL (ref 0.3–1.2)
BUN: 13 mg/dL (ref 6–20)
CALCIUM: 9.1 mg/dL (ref 8.9–10.3)
CO2: 24 mmol/L (ref 22–32)
CREATININE: 0.76 mg/dL (ref 0.44–1.00)
Chloride: 104 mmol/L (ref 101–111)
GFR calc Af Amer: >60 mL/min (ref >60)
GFR calc non Af Amer: >60 mL/min (ref >60)
GLUCOSE: 93 mg/dL (ref 65–99)
Potassium: 3.9 mmol/L (ref 3.5–5.1)
SODIUM: 137 mmol/L (ref 135–145)
Total Protein: 7.5 g/dL (ref 6.5–8.1)

## 2016-08-27 LAB — CBC
HEMATOCRIT: 38 % (ref 36.0–46.0)
Hemoglobin: 12.4 g/dL (ref 12.0–15.0)
MCH: 26 pg (ref 26.0–34.0)
MCHC: 32.6 g/dL (ref 30.0–36.0)
MCV: 79.7 fL (ref 78.0–100.0)
Platelets: 322 10*3/uL (ref 150–400)
RBC: 4.77 MIL/uL (ref 3.87–5.11)
RDW: 14.9 % (ref 11.5–15.5)
WBC: 6.5 10*3/uL (ref 4.0–10.5)

## 2016-08-27 LAB — URINALYSIS, ROUTINE W REFLEX MICROSCOPIC
Bilirubin Urine: NEGATIVE
Glucose, UA: NEGATIVE mg/dL
HGB URINE DIPSTICK: NEGATIVE
KETONES UR: NEGATIVE mg/dL
Leukocytes, UA: NEGATIVE
NITRITE: NEGATIVE
Protein, ur: NEGATIVE mg/dL
SPECIFIC GRAVITY, URINE: 1.02 (ref 1.005–1.030)
pH: 5.5 (ref 5.0–8.0)

## 2016-08-27 LAB — WET PREP, GENITAL
CLUE CELLS WET PREP: NONE SEEN
Sperm: NONE SEEN
Trich, Wet Prep: NONE SEEN
Yeast Wet Prep HPF POC: NONE SEEN

## 2016-08-27 LAB — LIPASE, BLOOD: Lipase: 30 U/L (ref 11–51)

## 2016-08-27 LAB — PREGNANCY, URINE: Preg Test, Ur: NEGATIVE

## 2016-08-27 MED ORDER — IOPAMIDOL (ISOVUE-300) INJECTION 61%
100.0000 mL | Freq: Once | INTRAVENOUS | Status: AC | PRN
  Administered 2016-08-27: 100 mL via INTRAVENOUS

## 2016-08-27 MED ORDER — ONDANSETRON 4 MG PO TBDP
4.0000 mg | ORAL_TABLET | Freq: Three times a day (TID) | ORAL | 0 refills | Status: AC | PRN
Start: 2016-08-27 — End: ?

## 2016-08-27 MED ORDER — IBUPROFEN 800 MG PO TABS: 800.0000 mg | ORAL_TABLET | Freq: Three times a day (TID) | ORAL | 0 refills | Status: AC

## 2016-08-27 MED ORDER — ONDANSETRON HCL 4 MG/2ML IJ SOLN
4.0000 mg | Freq: Once | INTRAMUSCULAR | Status: AC
  Administered 2016-08-27: 4 mg via INTRAVENOUS
  Filled 2016-08-27: qty 2

## 2016-08-27 MED ORDER — OXYCODONE-ACETAMINOPHEN 5-325 MG PO TABS: 1.0000 | ORAL_TABLET | Freq: Four times a day (QID) | ORAL | 0 refills | Status: AC | PRN

## 2016-08-27 NOTE — ED Provider Notes (Signed)
[facility]   CSN: [id number] Arrival date & time: 08/27/16  1928     History   Chief Complaint Chief Complaint  Patient presents with  . Abdominal Pain    HPI Taylor Gomez is a 26 y.o. female.  HPI   26 year old female presenting with abdominal pain. Patient reports it started today. She reports she's noticed some swelling to her umbilicus for the last several days. However today she noticed that she had pain in that area. She has mild nausea. She one episode of loose stools. No recent travel. No abdominal surgeries. Patient has not noted any fevers. No abnormal food intake.  Past Medical History:  Diagnosis Date  . Anxiety   . Asthma   . Seasonal allergies   . Thyroid disease     There are no active problems to display for this patient.   Past Surgical History:  Procedure Laterality Date  . DENTAL SURGERY      OB History    No data available       Home Medications    Prior to Admission medications   Medication Sig Start Date End Date Taking? Authorizing Provider  Mirtazapine (REMERON PO) Take by mouth.   Yes [provider]  Sertraline HCl (ZOLOFT PO) Take by mouth.   Yes [provider]  albuterol (PROVENTIL HFA;VENTOLIN HFA) 108 (90 BASE) MCG/ACT inhaler Inhale 1 puff into the lungs every 6 (six) hours as needed for wheezing or shortness of breath.    [provider]  albuterol (PROVENTIL) (2.5 MG/3ML) 0.083% nebulizer solution Take 2.5 mg by nebulization every 6 (six) hours as needed for wheezing or shortness of breath.    [provider]  loratadine (CLARITIN) 10 MG tablet Take 1 tablet (10 mg total) by mouth daily. Patient not taking: Reported on 07/01/2014 03/26/14   Marlon Pel, PA-C  montelukast (SINGULAIR) 10 MG tablet Take 10 mg by mouth at bedtime.    [provider]  omeprazole (PRILOSEC) 20 MG capsule Take 1 capsule (20 mg total) by mouth 2 (two) times daily. 11/18/15   Rolland Porter, MD    Family History No family history on file.  Social History Social History  Substance Use Topics  . Smoking status: Never Smoker  . Smokeless tobacco: Never Used  . Alcohol use No     Allergies   Patient has no known allergies.   Review of Systems Review of Systems  Constitutional: Negative for activity change.  Respiratory: Negative for shortness of breath.   Cardiovascular: Negative for chest pain.  Gastrointestinal: Positive for abdominal pain, diarrhea and nausea. Negative for vomiting.     Physical Exam Updated Vital Signs BP 116/78 (BP Location: Left Arm)   Pulse 84   Temp 98.1 F (36.7 C) (Oral)   Resp 18   Ht 5\' 6"  (1.676 m)   Wt 107 kg (235 lb 14.3 oz)   SpO2 100%   BMI 38.07 kg/m   Physical Exam  Constitutional: She is oriented to person, place, and time. She appears well-developed and well-nourished.  HENT:  Head: Normocephalic and atraumatic.  Eyes: Right eye exhibits no discharge.  Cardiovascular: Normal rate and regular rhythm.   Pulmonary/Chest: Effort normal and breath sounds normal. No respiratory distress.  Abdominal: Soft. There is tenderness.  Mild tenderness to palpation periumbilically.  No evidence of swelling. No erythema. No skin changes.  Genitourinary: Vagina normal.  Genitourinary Comments: No CMT.  Neurological: She is oriented to person, place,  and time.  Skin: Skin is warm and dry. She is not diaphoretic.  Psychiatric: She has a normal mood and affect.  Nursing note and vitals reviewed.    ED Treatments / Results  Labs (all labs ordered are listed, but only abnormal results are displayed) Labs Reviewed  WET PREP, GENITAL  URINALYSIS, ROUTINE W REFLEX MICROSCOPIC  PREGNANCY, URINE  CBC  LIPASE, BLOOD  COMPREHENSIVE METABOLIC PANEL  GC/CHLAMYDIA PROBE AMP ([place]) NOT AT [facility]    EKG  EKG Interpretation None       Radiology No results found.  Procedures Procedures (including critical care  time)  Medications Ordered in ED Medications - No data to display   Initial Impression / Assessment and Plan / ED Course  I have reviewed the triage vital signs and the nursing notes.  Pertinent labs & imaging results that were available during my care of the patient were reviewed by me and considered in my medical decision making (see chart for details).     Patient's 26 year old female presenting with abdominal pain. Patient has mild tenderness periumbilically. Exam is consistent with fat containing hernia. We'll get CT. Patient also complained of discharge vaginally. We'll do vaginal exam.  Vaginal exam reassuring.  CT shows fat-containing hernia. Patient does appear very comfortable. She's not required any pain meds since arrival. We will have her follow up with outpatient with surgery. We told her to return with any increasing pain, skin discoloration, fevers or other concerns.  (grandmothere here to and in agreement with plan)   Taking PO, normal vitals and pain free on discharge.   Final Clinical Impressions(s) / ED Diagnoses   Final diagnoses:  None    New Prescriptions New Prescriptions   No medications on file     Abelino DerrickMackuen, Courteney Lyn, MD 08/27/16 2348

## 2016-08-27 NOTE — ED Triage Notes (Signed)
C/o abd pain x today-NAD-steady gait 

## 2016-08-27 NOTE — ED Notes (Signed)
Patient transported to CT 

## 2016-08-27 NOTE — Discharge Instructions (Signed)
You have a "fat containing hernia". Sometimes these require surgeries.. Especially if the pain is too much tolerated at home. We'll give you some pain medication to take at home and then follow up with a surgeon.  If your pain gets too much , or any change in color of the skin, or you have any concerns at all please return immediately to the emergency department, you may require surgery.

## 2016-08-27 NOTE — ED Notes (Signed)
ED Provider at bedside discussing test results and dispo plan of care. 

## 2016-08-27 NOTE — ED Notes (Signed)
Water given to drink per pts preference.

## 2016-08-28 LAB — GC/CHLAMYDIA PROBE AMP (~~LOC~~) NOT AT ARMC
Chlamydia: NEGATIVE
Neisseria Gonorrhea: NEGATIVE

## 2016-12-31 IMAGING — CT CT ABD-PELV W/O CM
3 of 4 series · 13 of 36 positions shown, 19 images · non-contrast
Comparison: 09/28/2013

CLINICAL DATA: right flank pain. Pain radiates to the right lower
quadrant, 12 days duration. Nausea and vomiting. Micro hematuria.

EXAM:
CT ABDOMEN AND PELVIS WITHOUT CONTRAST
TECHNIQUE: Multidetector CT imaging of the abdomen and pelvis was performed
following the standard protocol without IV contrast.

[Series 3: (person_name) #2 · axial · 0.78mm/px · z∈[-400,-130]mm · 7 of 72 slices shown, 12 images]
[im 9/72  soft-tissue]
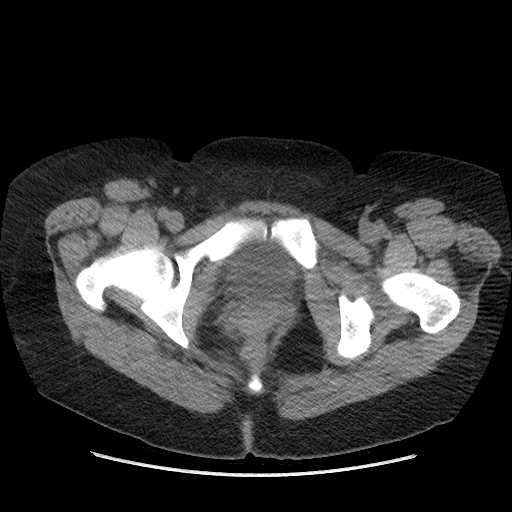
[im 9/72  bone]
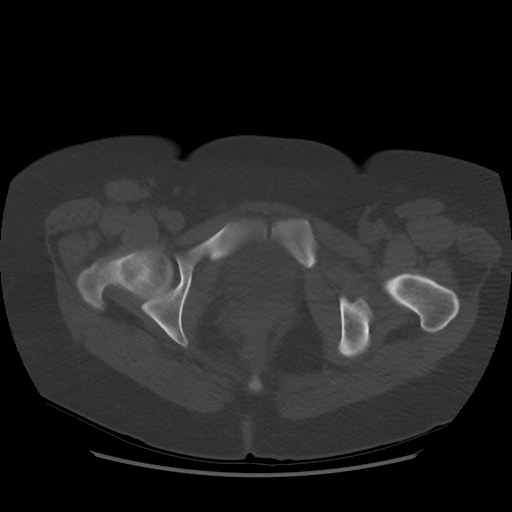
[im 18/72  soft-tissue]
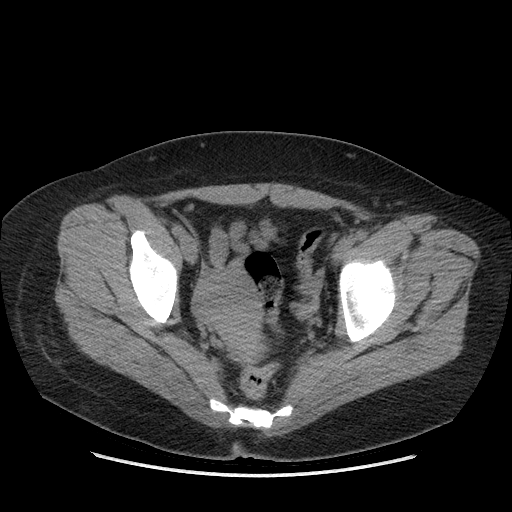
[im 27/72  soft-tissue]
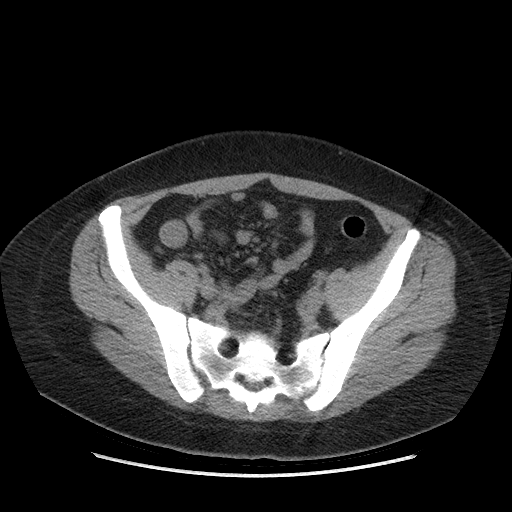
[im 36/72  soft-tissue]
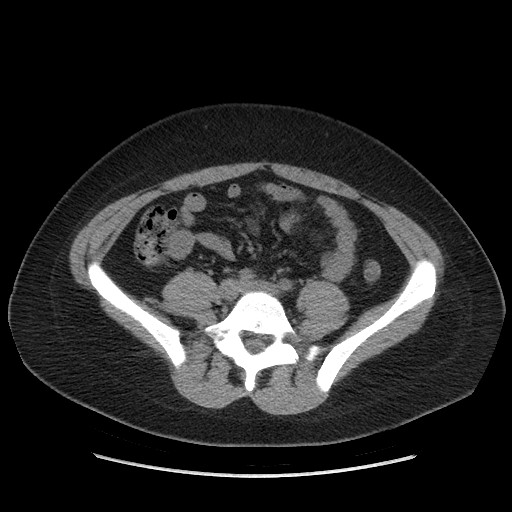
[im 36/72  lung]
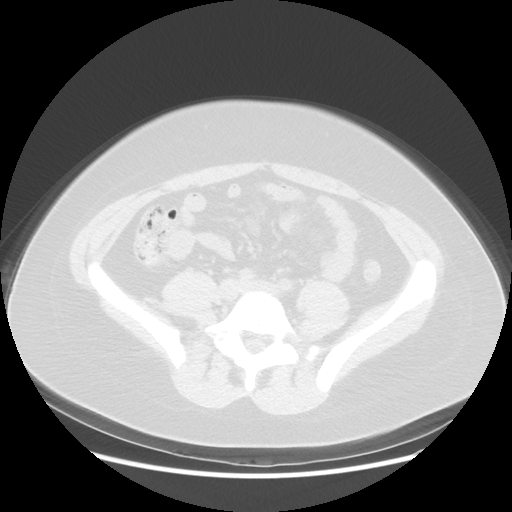
[im 45/72  soft-tissue]
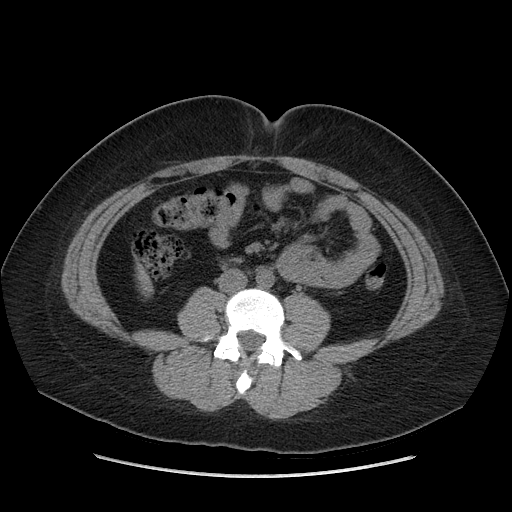
[im 45/72  lung]
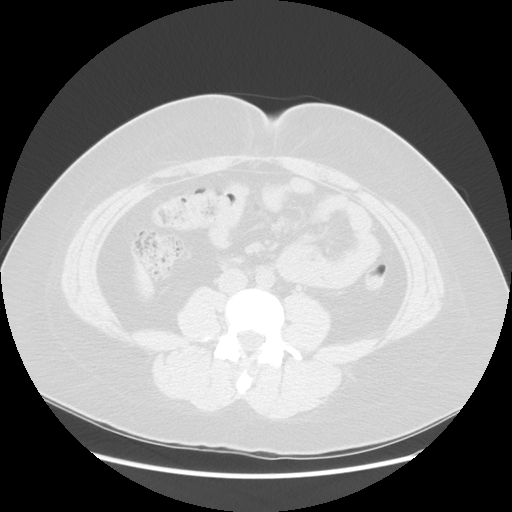
[im 54/72  soft-tissue]
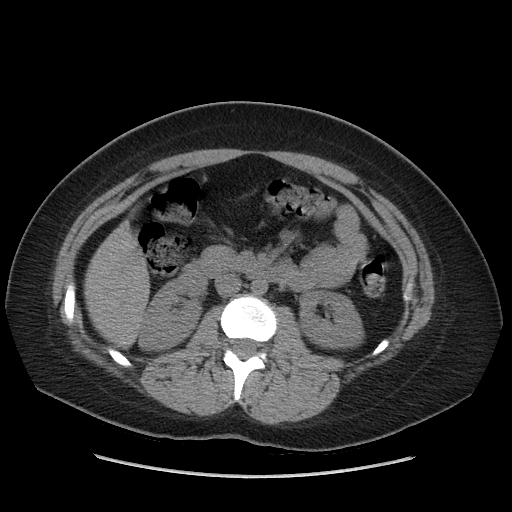
[im 54/72  lung]
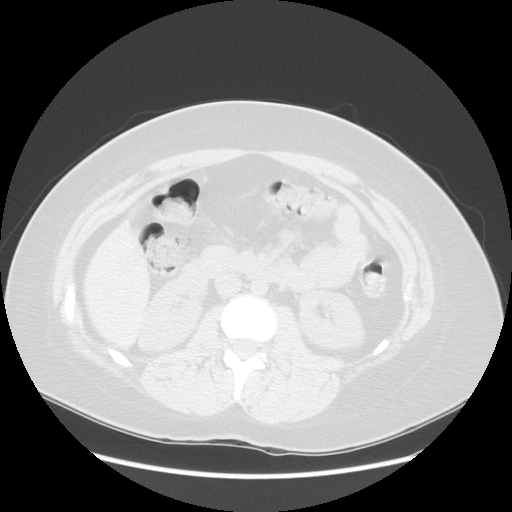
[im 63/72  soft-tissue]
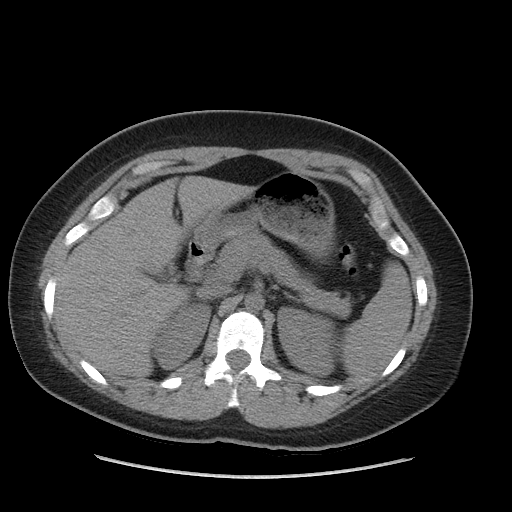
[im 63/72  lung]
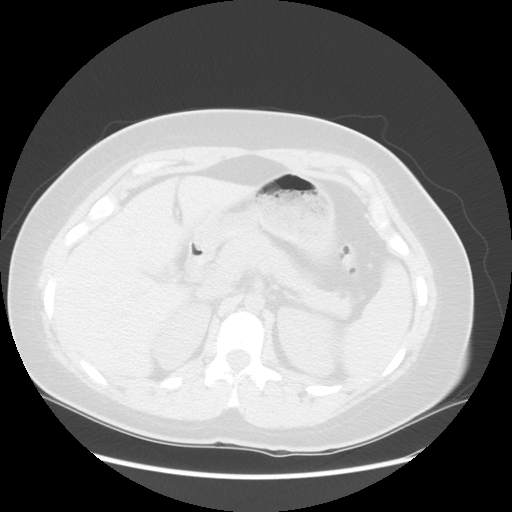

[Series 601: coronal body · coronal · 0.78mm/px · 1 of 119 slices shown, 2 images]
[im 40/119  soft-tissue]
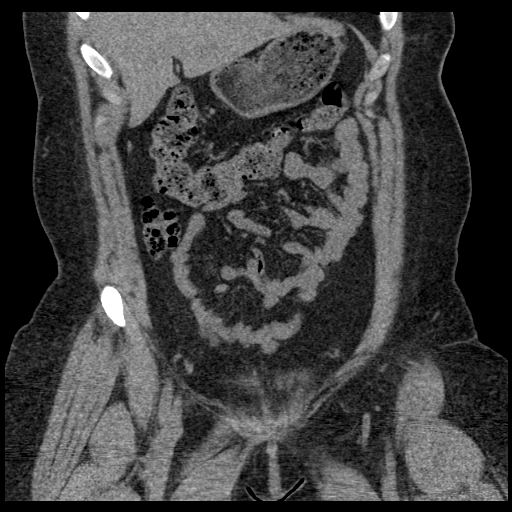
[im 40/119  bone]
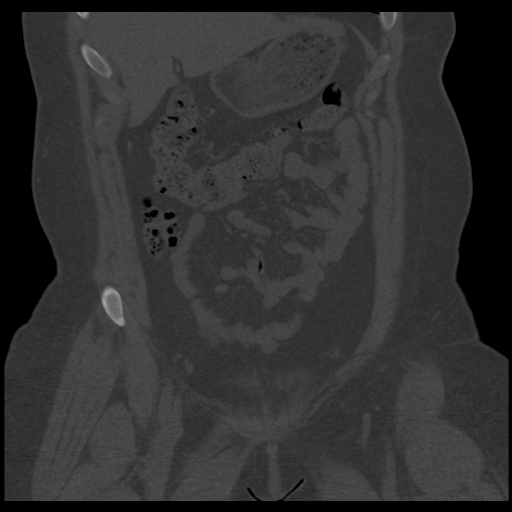

[Series 602: sagittal body · sagittal · 0.78mm/px · 5 of 161 slices shown]
[im 17/161  soft-tissue]
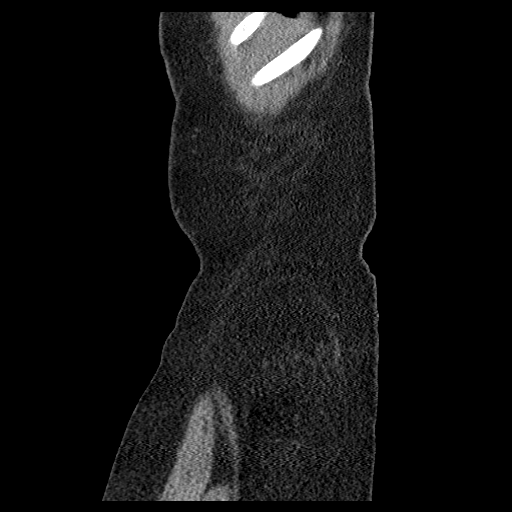
[im 33/161  soft-tissue]
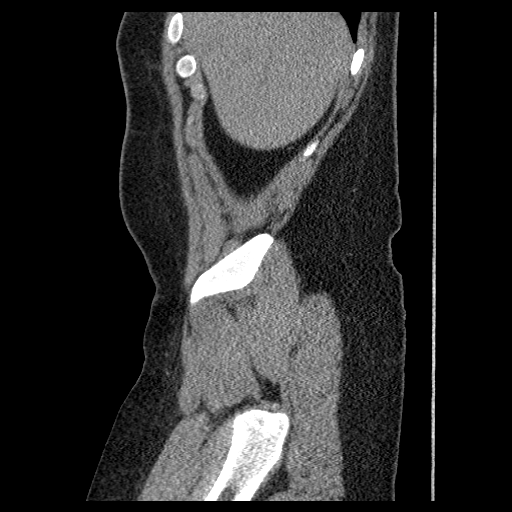
[im 49/161  soft-tissue]
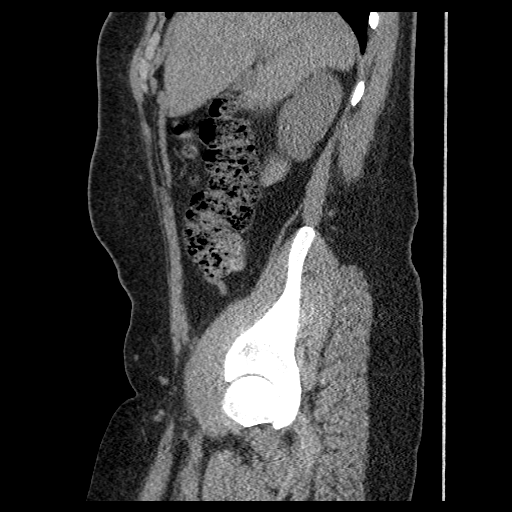
[im 73/161  soft-tissue]
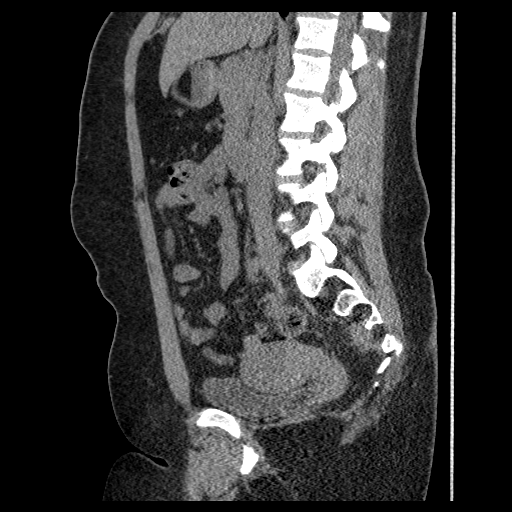
[im 89/161  soft-tissue]
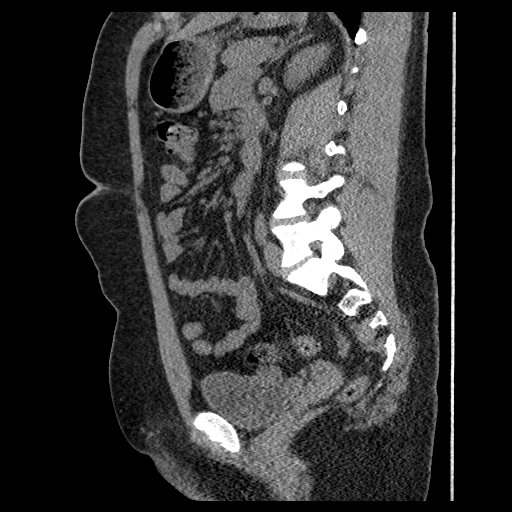

[13 of 36 positions shown; findings below may reference images not displayed]

FINDINGS: Posterior costophrenic angle lung bases are clear. Visualized
portions of the liver appear normal. No calcified gallstones.
Visualized portion of the spleen is normal. Pancreas appears normal
without contrast. No adrenal mass. The kidneys are normal without
evidence of cyst, mass, stone or hydronephrosis. No stone is seen
along the course of either ureter or in the bladder. The aorta and
IVC are normal. No retroperitoneal mass or lymphadenopathy. The
appendix is normal. No bowel pathology is seen. Uterus and adnexal
regions appear normal. No bony finding.
IMPRESSION: Negative study. No evidence of urinary tract stone disease or other
explanation of the presenting symptoms.
